# Patient Record
Sex: Male | Born: 1950
Health system: Southern US, Community
[De-identification: ages and names within clinical notes are randomized; demographics above are authoritative.]

## PROBLEM LIST (undated history)

## (undated) DIAGNOSIS — I10 Essential (primary) hypertension: Secondary | ICD-10-CM

## (undated) DIAGNOSIS — N189 Chronic kidney disease, unspecified: Secondary | ICD-10-CM

## (undated) DIAGNOSIS — E7800 Pure hypercholesterolemia, unspecified: Secondary | ICD-10-CM

## (undated) DIAGNOSIS — Z720 Tobacco use: Secondary | ICD-10-CM

## (undated) DIAGNOSIS — B1910 Unspecified viral hepatitis B without hepatic coma: Secondary | ICD-10-CM

## (undated) HISTORY — DX: Unspecified viral hepatitis B without hepatic coma: B19.10

## (undated) HISTORY — DX: Tobacco use: Z72.0

## (undated) HISTORY — DX: Chronic kidney disease, unspecified: N18.9

---

## 2014-09-10 ENCOUNTER — Ambulatory Visit (INDEPENDENT_AMBULATORY_CARE_PROVIDER_SITE_OTHER): Payer: BLUE CROSS/BLUE SHIELD

## 2014-09-10 ENCOUNTER — Ambulatory Visit (INDEPENDENT_AMBULATORY_CARE_PROVIDER_SITE_OTHER): Payer: BLUE CROSS/BLUE SHIELD | Admitting: Emergency Medicine

## 2014-09-10 VITALS — BP 115/70 | HR 79 | Temp 97.8°F | Resp 16 | Ht 65.5 in | Wt 128.0 lb

## 2014-09-10 DIAGNOSIS — Z0000 Encounter for general adult medical examination without abnormal findings: Secondary | ICD-10-CM | POA: Diagnosis not present

## 2014-09-10 DIAGNOSIS — R938 Abnormal findings on diagnostic imaging of other specified body structures: Secondary | ICD-10-CM

## 2014-09-10 DIAGNOSIS — F17200 Nicotine dependence, unspecified, uncomplicated: Secondary | ICD-10-CM

## 2014-09-10 DIAGNOSIS — B1910 Unspecified viral hepatitis B without hepatic coma: Secondary | ICD-10-CM | POA: Insufficient documentation

## 2014-09-10 DIAGNOSIS — K449 Diaphragmatic hernia without obstruction or gangrene: Secondary | ICD-10-CM | POA: Diagnosis not present

## 2014-09-10 DIAGNOSIS — Z125 Encounter for screening for malignant neoplasm of prostate: Secondary | ICD-10-CM

## 2014-09-10 DIAGNOSIS — R9389 Abnormal findings on diagnostic imaging of other specified body structures: Secondary | ICD-10-CM

## 2014-09-10 DIAGNOSIS — Z23 Encounter for immunization: Secondary | ICD-10-CM | POA: Diagnosis not present

## 2014-09-10 DIAGNOSIS — B169 Acute hepatitis B without delta-agent and without hepatic coma: Secondary | ICD-10-CM | POA: Diagnosis not present

## 2014-09-10 DIAGNOSIS — Z720 Tobacco use: Secondary | ICD-10-CM | POA: Diagnosis not present

## 2014-09-10 DIAGNOSIS — Z1329 Encounter for screening for other suspected endocrine disorder: Secondary | ICD-10-CM

## 2014-09-10 DIAGNOSIS — Q790 Congenital diaphragmatic hernia: Secondary | ICD-10-CM

## 2014-09-10 DIAGNOSIS — Z13220 Encounter for screening for lipoid disorders: Secondary | ICD-10-CM

## 2014-09-10 LAB — POCT URINALYSIS DIPSTICK
BILIRUBIN UA: NEGATIVE
Glucose, UA: NEGATIVE
Ketones, UA: NEGATIVE
Leukocytes, UA: NEGATIVE
Nitrite, UA: NEGATIVE
PH UA: 7.5
Protein, UA: NEGATIVE
Spec Grav, UA: 1.015
UROBILINOGEN UA: 0.2

## 2014-09-10 LAB — COMPREHENSIVE METABOLIC PANEL
ALT: 108 U/L — ABNORMAL HIGH (ref 0–53)
AST: 67 U/L — ABNORMAL HIGH (ref 0–37)
Albumin: 4.3 g/dL (ref 3.5–5.2)
Alkaline Phosphatase: 92 U/L (ref 39–117)
BUN: 10 mg/dL (ref 6–23)
CO2: 27 mEq/L (ref 19–32)
CREATININE: 0.8 mg/dL (ref 0.50–1.35)
Calcium: 9.3 mg/dL (ref 8.4–10.5)
Chloride: 102 mEq/L (ref 96–112)
Glucose, Bld: 96 mg/dL (ref 70–99)
Potassium: 4.3 mEq/L (ref 3.5–5.3)
Sodium: 137 mEq/L (ref 135–145)
TOTAL PROTEIN: 7.5 g/dL (ref 6.0–8.3)
Total Bilirubin: 0.7 mg/dL (ref 0.2–1.2)

## 2014-09-10 LAB — TSH: TSH: 0.599 u[IU]/mL (ref 0.350–4.500)

## 2014-09-10 LAB — LIPID PANEL
Cholesterol: 239 mg/dL — ABNORMAL HIGH (ref 0–200)
HDL: 51 mg/dL (ref >=40)
LDL Cholesterol: 166 mg/dL — ABNORMAL HIGH (ref 0–99)
TRIGLYCERIDES: 108 mg/dL (ref <150)
Total CHOL/HDL Ratio: 4.7 Ratio
VLDL: 22 mg/dL (ref 0–40)

## 2014-09-10 LAB — IFOBT (OCCULT BLOOD): IMMUNOLOGICAL FECAL OCCULT BLOOD TEST: NEGATIVE

## 2014-09-10 NOTE — Progress Notes (Signed)
   Subjective:    Patient ID: Isla Penceanh Halvorson, male    DOB: 03/05/1951, 64 y.o.   MRN: [id number]  This chart was scribed for Collene GobbleSteven A Kee Drudge, MD by Ronney LionSuzanne Le, ED Scribe. This patient was seen in room 9 and the patient's care was started at 10:31 AM.   Chief Complaint  Patient presents with  . Annual Exam     HPI  The history is provided by the patient. A language interpreter was used. HPI Comments: [name] Mossberg is a 64 y.o. male who presents to the [facility] for a complete physical exam. Patient has a history of Hepatitis B, but has not been treated for it. Patient just [place] 2 years and 4 months ago. He denies taking any medications. He denies chest pain, abdominal pain, or weight loss. Patient is 0.5 PPD smoker. He denies EtOH consumption. Patient is currently employed.   Interpreter Name: An   Review of Systems  Constitutional: Negative for unexpected weight change.  Cardiovascular: Negative for chest pain.  Gastrointestinal: Negative for abdominal pain.      Objective:   Physical Exam  Nursing note and vitals reviewed.  CONSTITUTIONAL: Well developed/well nourished HEAD: Normocephalic/atraumatic EYES: EOMI/PERRL ENMT: Mucous membranes moist NECK: supple no meningeal signs SPINE/BACK:entire spine nontender CV: S1/S2 noted, no murmurs/rubs/gallops noted LUNGS: Lungs are clear to auscultation bilaterally, no apparent distress ABDOMEN: soft, nontender, no rebound or guarding, bowel sounds noted throughout abdomen GU:no cva tenderness NEURO: Pt is awake/alert/appropriate, moves all extremitiesx4.  No facial droop.   EXTREMITIES: pulses normal/equal, full ROM SKIN: warm, color normal PSYCH: no abnormalities of mood noted, alert and oriented to situation  [facility] reading (PRIMARY) by  Dr.Pranit Owensby there is elevation of the right hemidiaphragm with blunting of the right costophrenic angle. Question whether this is scarring or secondary to  fluid.     Assessment & Plan:  Patient is a smoker with history of hepatitis B. He does not speak any [place]. Routine labs were done to see on the status of his hepatitis B. I will need to see the patient back for follow-up to see what immunizations are indicated and decide on the next step for management of his hep B and abnormal chest x-rI did go ahead and give him a  Hep A  vaccine.I personally performed the services described in this documentation, which was scribed in my presence. The recorded information has been reviewed and is accurate.

## 2014-09-11 LAB — HEPATITIS B SURFACE ANTIGEN: Hepatitis B Surface Ag: POSITIVE — AB

## 2014-09-11 LAB — HEPATITIS B SURF AG CONFIRMATION: HEPATITIS B SURFACE ANTIGEN CONFIRMATION: POSITIVE — AB

## 2014-09-11 LAB — HEPATITIS C ANTIBODY: HCV Ab: NEGATIVE

## 2014-09-12 ENCOUNTER — Other Ambulatory Visit: Payer: Self-pay | Admitting: Emergency Medicine

## 2014-09-12 DIAGNOSIS — B1910 Unspecified viral hepatitis B without hepatic coma: Secondary | ICD-10-CM

## 2014-09-12 LAB — PSA: PSA: 1.42 ng/mL (ref <=4.00)

## 2014-09-12 LAB — AFP TUMOR MARKER: AFP-Tumor Marker: 3.5 ng/mL (ref <6.1)

## 2014-09-13 ENCOUNTER — Encounter: Payer: Self-pay | Admitting: Family Medicine

## 2014-09-14 LAB — HEPATITIS B E ANTIBODY: HEPATITIS BE ANTIBODY: REACTIVE — AB

## 2014-09-14 LAB — HEPATITIS B E ANTIGEN: Hepatitis Be Antigen: NONREACTIVE

## 2014-10-01 ENCOUNTER — Ambulatory Visit (INDEPENDENT_AMBULATORY_CARE_PROVIDER_SITE_OTHER): Payer: BLUE CROSS/BLUE SHIELD | Admitting: Emergency Medicine

## 2014-10-01 VITALS — BP 122/68 | HR 80 | Temp 97.8°F | Resp 16 | Ht 65.0 in | Wt 127.2 lb

## 2014-10-01 DIAGNOSIS — E785 Hyperlipidemia, unspecified: Secondary | ICD-10-CM

## 2014-10-01 DIAGNOSIS — B1910 Unspecified viral hepatitis B without hepatic coma: Secondary | ICD-10-CM

## 2014-10-01 NOTE — Progress Notes (Signed)
Subjective:    Patient ID: Tyler Carter, male    DOB: 02/20/1951, 64 y.o.   MRN: [id number] This chart was scribed for Tyler ChrisSteven Compton Brigance, MD by Phillis HaggisGabriella Gaje, ED Scribe. This patient was seen in room 3 and patient care was started at 9:14 AM.   HPI HPI Comments: Tyler Carter is a 64 y.o. male who presents to the [facility] requesting a follow up on his blood test. He does not currently have an appointment to see a specialist. His son states that his father reports pain near his liver. His son reports that he found out about his Hepatitis B diagnosis in [place] 10 years ago. His son states that his father moved to [place] two years ago. Patient speaks [place].   Interpreter name: Ten, his son    Review of Systems  Constitutional: Negative for fever and chills.  HENT: Negative for sore throat.   Respiratory: Negative for cough and shortness of breath.   Gastrointestinal: Positive for abdominal pain. Negative for nausea and vomiting.      Objective:   Physical Exam  Constitutional: He appears well-developed and well-nourished. No distress.  HENT:  Head: Normocephalic and atraumatic.  Eyes: Conjunctivae are normal. Right eye exhibits no discharge. Left eye exhibits no discharge.  Neck: Neck supple.  Cardiovascular: Normal rate, regular rhythm and normal heart sounds.  Exam reveals no gallop and no friction rub.   No murmur heard. Pulmonary/Chest: Effort normal and breath sounds normal. No respiratory distress.  Abdominal: Soft. He exhibits no distension. There is no tenderness.  Liver is not enlarged. No right upper abdominal tenderness.   Musculoskeletal: He exhibits no edema or tenderness.  Neurological: He is alert.  Skin: Skin is warm and dry.  Psychiatric: He has a normal mood and affect. His behavior is normal. Thought content normal.  Nursing note and vitals reviewed.  Results for orders placed or performed in visit on 09/10/14  Hepatitis B surface  antigen  Result Value Ref Range   Hepatitis B Surface Ag POSITIVE (A) NEGATIVE  Hepatitis B E Antibody  Result Value Ref Range   Hepatitis Be Antibody REACTIVE (A) NON-REACTIVE  Hepatitis B E Antigen  Result Value Ref Range   Hepatitis Be Antigen NON-REACTIVE NON-REACTIVE  AFP tumor marker  Result Value Ref Range   AFP-Tumor Marker 3.5 <6.1 ng/mL  Hepatitis C antibody  Result Value Ref Range   HCV Ab NEGATIVE NEGATIVE  Comprehensive metabolic panel  Result Value Ref Range   Sodium 137 135 - 145 mEq/L   Potassium 4.3 3.5 - 5.3 mEq/L   Chloride 102 96 - 112 mEq/L   CO2 27 19 - 32 mEq/L   Glucose, Bld 96 70 - 99 mg/dL   BUN 10 6 - 23 mg/dL   Creat 7.820.80 9.560.50 - 2.131.35 mg/dL   Total Bilirubin 0.7 0.2 - 1.2 mg/dL   Alkaline Phosphatase 92 39 - 117 U/L   AST 67 (H) 0 - 37 U/L   ALT 108 (H) 0 - 53 U/L   Total Protein 7.5 6.0 - 8.3 g/dL   Albumin 4.3 3.5 - 5.2 g/dL   Calcium 9.3 8.4 - 08.610.5 mg/dL  TSH  Result Value Ref Range   TSH 0.599 0.350 - 4.500 uIU/mL  Lipid panel  Result Value Ref Range   Cholesterol 239 (H) 0 - 200 mg/dL   Triglycerides 578108 <469<150 mg/dL   HDL 51 >=62>=40 mg/dL   Total CHOL/HDL Ratio 4.7 Ratio  VLDL 22 0 - 40 mg/dL   LDL Cholesterol 161 (H) 0 - 99 mg/dL  PSA  Result Value Ref Range   PSA 1.42 <=4.00 ng/mL  Hepatitis B Surf Ag Confirmation  Result Value Ref Range   Hepatitis B Surf Ag Confirmation POS (A) NEGATIVE  IFOBT POC (occult bld, rslt in office)  Result Value Ref Range   IFOBT Negative   POCT urinalysis dipstick  Result Value Ref Range   Color, UA yellow    Clarity, UA clear    Glucose, UA neg    Bilirubin, UA neg    Ketones, UA neg    Spec Grav, UA 1.015    Blood, UA trace-intact    pH, UA 7.5    Protein, UA neg    Urobilinogen, UA 0.2    Nitrite, UA neg    Leukocytes, UA Negative       Assessment & Plan:  Patient has hepatitis B with elevated liver tests. He is he antibody positive E antigen negative. His lipids are high but I do not  want to put him on a statin. Referral has been made to infectious disease and referral made for an ultrasound of the upper abdomen.I personally performed the services described in this documentation, which was scribed in my presence. The recorded information has been reviewed and is accurate. Since I did not put him on cholesterol medicines I will have him take a baby aspirin one a day. He is also advised to quit smoking.

## 2014-10-01 NOTE — Progress Notes (Signed)
Subjective:    Patient ID: Tyler Carter, male    DOB: 02/20/1951, 64 y.o.   MRN: [id number] This chart was scribed for Tyler ChrisSteven Jerimiah Wolman, MD by Phillis HaggisGabriella Gaje, ED Scribe. This patient was seen in room 3 and patient care was started at 9:14 AM.   HPI HPI Comments: Tyler Carter is a 64 y.o. male who presents to the [facility] requesting a follow up on his blood test. He does not currently have an appointment to see a specialist. His son states that his father reports pain near his liver. His son reports that he found out about his Hepatitis B diagnosis in [place] 10 years ago. His son states that his father moved to [place] two years ago. Patient speaks [place].   Interpreter name: Ten, his son    Review of Systems  Constitutional: Negative for fever and chills.  HENT: Negative for sore throat.   Respiratory: Negative for cough and shortness of breath.   Gastrointestinal: Positive for abdominal pain. Negative for nausea and vomiting.      Objective:   Physical Exam  Constitutional: He appears well-developed and well-nourished. No distress.  HENT:  Head: Normocephalic and atraumatic.  Eyes: Conjunctivae are normal. Right eye exhibits no discharge. Left eye exhibits no discharge.  Neck: Neck supple.  Cardiovascular: Normal rate, regular rhythm and normal heart sounds.  Exam reveals no gallop and no friction rub.   No murmur heard. Pulmonary/Chest: Effort normal and breath sounds normal. No respiratory distress.  Abdominal: Soft. He exhibits no distension. There is no tenderness.  Liver is not enlarged. No right upper abdominal tenderness.   Musculoskeletal: He exhibits no edema or tenderness.  Neurological: He is alert.  Skin: Skin is warm and dry.  Psychiatric: He has a normal mood and affect. His behavior is normal. Thought content normal.  Nursing note and vitals reviewed.  Results for orders placed or performed in visit on 09/10/14  Hepatitis B surface  antigen  Result Value Ref Range   Hepatitis B Surface Ag POSITIVE (A) NEGATIVE  Hepatitis B E Antibody  Result Value Ref Range   Hepatitis Be Antibody REACTIVE (A) NON-REACTIVE  Hepatitis B E Antigen  Result Value Ref Range   Hepatitis Be Antigen NON-REACTIVE NON-REACTIVE  AFP tumor marker  Result Value Ref Range   AFP-Tumor Marker 3.5 <6.1 ng/mL  Hepatitis C antibody  Result Value Ref Range   HCV Ab NEGATIVE NEGATIVE  Comprehensive metabolic panel  Result Value Ref Range   Sodium 137 135 - 145 mEq/L   Potassium 4.3 3.5 - 5.3 mEq/L   Chloride 102 96 - 112 mEq/L   CO2 27 19 - 32 mEq/L   Glucose, Bld 96 70 - 99 mg/dL   BUN 10 6 - 23 mg/dL   Creat 7.820.80 9.560.50 - 2.131.35 mg/dL   Total Bilirubin 0.7 0.2 - 1.2 mg/dL   Alkaline Phosphatase 92 39 - 117 U/L   AST 67 (H) 0 - 37 U/L   ALT 108 (H) 0 - 53 U/L   Total Protein 7.5 6.0 - 8.3 g/dL   Albumin 4.3 3.5 - 5.2 g/dL   Calcium 9.3 8.4 - 08.610.5 mg/dL  TSH  Result Value Ref Range   TSH 0.599 0.350 - 4.500 uIU/mL  Lipid panel  Result Value Ref Range   Cholesterol 239 (H) 0 - 200 mg/dL   Triglycerides 578108 <469<150 mg/dL   HDL 51 >=62>=40 mg/dL   Total CHOL/HDL Ratio 4.7 Ratio  VLDL 22 0 - 40 mg/dL   LDL Cholesterol 409166 (H) 0 - 99 mg/dL  PSA  Result Value Ref Range   PSA 1.42 <=4.00 ng/mL  Hepatitis B Surf Ag Confirmation  Result Value Ref Range   Hepatitis B Surf Ag Confirmation POS (A) NEGATIVE  IFOBT POC (occult bld, rslt in office)  Result Value Ref Range   IFOBT Negative   POCT urinalysis dipstick  Result Value Ref Range   Color, UA yellow    Clarity, UA clear    Glucose, UA neg    Bilirubin, UA neg    Ketones, UA neg    Spec Grav, UA 1.015    Blood, UA trace-intact    pH, UA 7.5    Protein, UA neg    Urobilinogen, UA 0.2    Nitrite, UA neg    Leukocytes, UA Negative       Assessment & Plan:  Patient has hepatitis B with elevated liver tests. He is he antibody positive E antigen negative. His lipids are high but I do not  want to put him on a statin. Referral has been made to infectious disease and referral made for an ultrasound of the upper abdomen.I personally performed the services described in this documentation, which was scribed in my presence. The recorded information has been reviewed and is accurate.

## 2014-10-01 NOTE — Patient Instructions (Addendum)
Please return here in September to have your second hepatitis A vaccination. We are making you appointments to see a hepatitis specialist and to have an x-ray of your upper abdomen.Cai thu?c l (Smoking Cessation) B? ht thu?c c  ngh?a quan tr?ng ??i v?i s?c kh?e c?a qu v? v c nhi?u l?i ch. Tuy nhin, khng ph?i lun d? dng b? thu?c v nicotine l m?t lo?i ch?t gy nghi?n. Thng th??ng, m?i ng??i c? g?ng t? 3 l?n tr? ln tr??c khi c th? b? thu?c l. Ti li?u ny gi?i thch nh?ng cch t?t nh?t ?? qu v? c th? chu?n b? cho vi?c b? ht thu?c. B? thu?c l l vi?c lm kh kh?n v c?n r?t nhi?u n? l?c, nh?ng qu v? c th? lm ?i?u ?. ?U ?I?M C?A B? HT THU?C  Qu v? s? s?ng lu h?n, c?m th?y kh?e h?n v s?ng t?t h?n.  C? th? c?a qu v? s? c?m nh?n ???c tc ??ng c?a vi?c b? thu?c g?n nh? ngay l?p t?c.  Trong vng 20 pht, huy?t p s? gi?m. M?ch ??p tr? l?i m?c bnh th??ng.  Sau 8 gi?, n?ng ?? cacbon monoxit trong mu s? tr? v? bnh th??ng. N?ng ?? oxy c?a qu v? t?ng.  Sau 24 gi?, nguy c? b? ?au tim b?t ??u gi?m. H?i th?, tc v c? th? qu v? s? h?t mi khi thu?c.  Sau 48 gi?, cc ??u dy th?n kinh b? t?n th??ng b?t ??u h?i ph?c. Kh?u gic v v? gic s? c?i thi?n.  Sau 72 gi?, c? th? h?u nh? khng cn nicotine. ?ng ph? qu?n s? gin ra v th? d? h?n.  Sau 2 ??n 12 tu?n, ph?i c th? gi? khng kh nhi?u h?n. T?p th? d?c tr? nn d? dng h?n v c?i thi?n tu?n hon.  Nguy c? b? nh?i mu c? tim, ??t qu?, ung th? ho?c b?nh ph?i gi?m ?ng k?.  Sau 1 n?m, nguy c? b? b?nh tim do m?ch vnh s? gi?m m?t n?a.  Sau 5 n?m, nguy c? ??t qu? gi?m xu?ng gi?ng nh? ng??i khng ht thu?c.  Sau 10 n?m, nguy c? b? ung th? ph?i gi?m ?i m?t n?a v nguy c? b? b?nh ung th? khc s? gi?m ?ng k?.  Sau 15 n?m, nguy c? b? b?nh tim do m?ch vnh gi?m, th??ng l b?ng m?c ?? c?a m?t ng??i khng ht thu?c.  N?u qu v? ?ang mang thai, b? ht thu?c s? c?i thi?n c? h?i c m?t em b kh?e m?nh.  Nh?ng ng??i s?ng chung v?i  qu v?, ??c bi?t l m?i tr? em, s? kh?e m?nh h?n.  Qu v? s? c thm ti?n ?? chi tiu vo nh?ng th? khc ngoi thu?c l. CC CU H?I C?N SUY NGH? TR??C KHI TM CCH B? THU?C L Qu v? c th? mu?n ni v? cc cu tr? l?i c?a qu v? v?i chuyn gia ch?m [place] s?c kh?e.  T?i sao qu v? mu?n b? thu?c l?  N?u qu v? ? c? g?ng b? thu?c l trong qu kh?, nh?ng g ? c tc d?ng v nh?ng g khng c tc d?ng v?i qu v??  ?u l nh?ng tnh hu?ng kh kh?n nh?t ??i v?i qu v? sau khi b? thu?c l? Qu v? ??nh x? l cc tnh hu?ng ? nh? th? no?  Ai c th? gip qu v? v??t qua nh?ng th?i ?i?m kh kh?n? Gia ?nh qu v?? B?n b? M?t chuyn gia ch?m [place] s?c kh?e?  Qu v? c?m  th?y thch th nh? th? no khi ht thu?c l? C cch no ?? v?n c ???c ni?m vui ? n?u qu v? b? thu?c l? ?y l m?t s? cu h?i ??t ra v?i chuyn gia ch?m [place] s?c kh?e c?a qu v?:  Lm th? no ng/b c th? gip ti b? thu?c l thnh cng?  Theo ng/b, lo?i thu?c no s? l t?t nh?t cho ti v ti nn dng n nh? th? no?  Ti c?n lm g n?u ti c?n thm tr? gip?  Cai thu?c s? c c?m gic nh? th? no? Lm th? no ti c th? l?y ???c thng tin v? cai thu?c? CHU?N B? S?N SNG  Ch?n ngy b? thu?c l.  Thay ??i mi tr??ng c?a qu v? b?ng cch v?t b? t?t c? thu?c l, g?t tn, dim v b?t l?a trong nh, trong xe ho?c ? n?i lm vi?c. Khng cho php m?i ng??i ht thu?c trong nh qu v?.  Xem l?i n? l?c b? thu?c l tr??c ?y c?a qu v?. Hy suy ngh? v? nh?ng g c hi?u qu? v nh?ng g khng. NH?N H? TR? V KHUY?N [facility] Qu v? c nhi?u kh? n?ng thnh cng h?n n?u ???c gip ??[place] v? c th? ???c h? tr? b?ng nhi?u cch.  Ni v?i gia ?nh, b?n b v ??ng nghi?p r?ng qu v? s? b? thu?c l v c?n s? h? tr? c?a h?. Yu c?u h? khng ht thu?c xung quanh qu v?.  Nh?n t? v?n v h? tr? c nhn, nhm ho?c qua ?i?n tho?i. Cc ch??ng trnh ???c cung c?p t?i cc b?nh vi?n v trung tm y t? ??a ph??ng. Hy g?i cho s? y t? ??a ph??ng ?? bi?t thng  tin v? cc ch??ng trnh trong khu v?c c?a qu v?.  Ni?m tin v th?c hnh tm linh c th? gip m?t s? ng??i ht thu?c b? thu?c l.  T?i v? "quit meter" (??ng h? b? thu?c l) trn my tnh c?a qu v? ?? theo di s? li?u th?ng k b? thu?c l, ch?ng h?n nh? qu v? ? khng ht thu?c ???c bao lu, s? l??ng thu?c khng ht v ti?n ti?t ki?m ???c.  Nh?n m?t cu?n sch t? gip ?? ?? b? thu?c l v trnh xa thu?c l. H?C CC K? N?NG V HNH VI M?I  T? ?nh l?c h??ng kh?i nhu c?u ht thu?c. Ni chuy?n v?i m?t ai ?, ?i b? ho?c gi?t th?i gian b?ng cng vi?c.  Thay ??i thi quen bnh th??ng c?a qu v?. Thay ??i l? trnh ??n n?i lm vi?c. U?ng tr thay cho c ph. ?n sng t?i m?t n?i khc.  Gi?m b?t c?ng th?ng. T?m n??c nng, t?p th? d?c ho?c ??c sch.  D? ??nh lm m?t vi?c g ? th v? m?i ngy. T? th??ng cho mnh do khng ht thu?c.  Khm ph cc ch??ng trnh t??ng tc d?a trn web chuyn gip qu v? b? thu?c l. MUA D??C PH?M V S? D?NG D??C PH?M H?P L D??c ph?m c th? gip qu v? ng?ng ht thu?c v gi?m c?n thm thu?c l. K?t h?p d??c ph?m v?i cc ph??ng php hnh vi v h? tr? ? trn c th? t?ng ?ng k? kh? n?ng b? thu?c l thnh cng c?a qu v?.  Li?u php thay th? nicotine gip cung c?p nicotine cho c? th? c?a qu v? m khng c?n tc ??ng tiu c?c v r?i ro c?a vi?c ht thu?c. Li?u php thay th? nicotine bao g?m k?o,  vin ng?m, thu?c ht, thu?c x?t m?i v mi?ng dn da ch?a nicotine. M?t s? c th? mua tr?c ti?p t?i hi?u thu?c, trong khi m?t s? khc c?n k ??n c?a bc s?.  Thu?c ch?ng tr?m c?m gip nh?ng ng??i b? ht thu?c, nh?ng khng r n ho?t ??ng nh? th? no. Lo?i thu?c ny c s?n theo ??n thu?c.  Thu?c ch? v?n m?t ph?n th? th? nicotinic m ph?ng tc ??ng c?a nicotin trong no c?a qu v?. Lo?i thu?c ny c s?n theo ??n thu?c. Hy h?i chuyn gia ch?m [place] s?c kh?e ?? ???c t? v?n v? nh?ng lo?i thu?c ?? s? d?ng v cch s? d?ng chng d?a vo b?nh s? c?a qu v?. Chuyn gia ch?m [place] s?c kh?e s? cho  qu v? bi?t c?n ??  nh?ng tc d?ng ph? no n?u qu v? ch?n s? d?ng m?t lo?i d??c ph?m ho?c li?u php. ??c k? thng tin trn bao b. Khng s? d?ng b?t k? s?n ph?m no khc c ch?a nicotine trong khi s? d?ng m?t s?n ph?m thay th? nicotine.  TI PHT HO?C TNH HU?NG KH KH?N H?u h?t cc tr??ng h?p ti pht x?y ra trong vng 3 thng ??u tin sau khi b? thu?c. Khng n?n lng n?u qu v? b?t ??u ht thu?c tr? l?i. Hy nh? r?ng, h?u h?t m?i ng??i c? g?ng nhi?u l?n tr??c khi b? h?n thu?c. Qu v? c th? c cc tri?u ch?ng cai thu?c v c? th? c?a qu v? ? quen v?i nicotine. Qu v? c th? thm thu?c l, b? kch thch, c?m th?y r?t ?i, ho th??ng xuyn, b? ?au ??u, ho?c kh t?p trung. Cc tri?u ch?ng t? b? ch? l t?m th?i. Cc tri?u ch?ng ny m?nh nh?t khi qu v? b?t ??u b? thu?c l, nh?ng chng s? bi?n m?t trong vng 10-14 ngy. ?? gi?m nguy c? ti pht, c? g?ng:  Trnh u?ng r??u. U?ng r??u lm gi?m c? h?i b? thu?c thnh cng c?a qu v?.  Gi?m l??ng caffeine qu v? tiu th?. M?t khi qu v? b? thu?c l, l??ng caffeine trong c? th? c?a qu v? t?ng v c th? t?o cho qu v? cc tri?u ch?ng nh? tim ??p nhanh, ?? m? hi v lo l?ng.  Trnh nh?ng ng??i ht thu?c v h? c th? khi?n qu v? mu?n ht thu?c.  ??ng ?? vi?c t?ng cn lm qu v? m?t t?p trung. Nhi?u ng??i ht thu?c s? t?ng cn khi b? thu?c, th??ng t h?n 10 pao. ?n m?t ch? ?? ?n u?ng lnh m?nh v duy tr ho?t ??ng. Qu v? lun c th? gi?m s? cn ? t?ng sau khi b? thu?c l.  Tm cch ?? c?i thi?n tm tr?ng c?a qu v? ngoi vi?c s? d?ng cch ht thu?c. ?? BI?T THM THNG TIN  www.smokefree.gov  Document Released: 09/18/2006 Document Revised: 10/18/2013 [facility] Patient Information 2015 [place], [place]. This information is not intended to replace advice given to you by your health care provider. Make sure you discuss any questions you have with your health care provider.

## 2014-10-31 ENCOUNTER — Ambulatory Visit: Payer: Self-pay | Admitting: Infectious Disease

## 2015-02-25 ENCOUNTER — Ambulatory Visit (INDEPENDENT_AMBULATORY_CARE_PROVIDER_SITE_OTHER): Payer: BLUE CROSS/BLUE SHIELD | Admitting: Family Medicine

## 2015-02-25 VITALS — BP 110/64 | HR 68 | Temp 98.1°F | Resp 16 | Ht 65.0 in | Wt 131.6 lb

## 2015-02-25 DIAGNOSIS — B181 Chronic viral hepatitis B without delta-agent: Secondary | ICD-10-CM

## 2015-02-25 DIAGNOSIS — Z789 Other specified health status: Secondary | ICD-10-CM | POA: Diagnosis not present

## 2015-02-25 DIAGNOSIS — J309 Allergic rhinitis, unspecified: Secondary | ICD-10-CM

## 2015-02-25 DIAGNOSIS — Z23 Encounter for immunization: Secondary | ICD-10-CM

## 2015-02-25 NOTE — Progress Notes (Addendum)
Subjective:  This chart was scribed for [name], MD by [facility], medical scribe at [facility].The patient was seen in exam room 09 and the patient's care was started at 8:59 AM.   Patient ID: Tyler Carter, male    DOB: 07-19-1950, 63 y.o.   MRN: [id number] Chief Complaint  Patient presents with  . Sinus Problem    phlem running down back of throat  . Hepatitis B    2nd    HPI HPI Comments: Tyler Carter is a 64 y.o. male who presents to [facility] persistent nasal congestion an a Hep A vaccine.  Nasal Congestion: Intermittent nasal congestion, for six years. Associated post nasal drip, rhinorrhea, sneezing, and cough. No fever, chest congestion, and dyspnea.  Hep A Vaccine: He also needs the Hep A Vaccine. First vaccine given March 26 th 2016. History of chronic hep B, with positive hep B surface antigen but negative Hep B-E antigen. Positive B-E Antibody. Referred on May 16 th to Infectious disease Patient did not go. Ordered US of his upper abdomen this has not been obtained.  Lab Results  Component Value Date   ALT 108* 09/10/2014   AST 67* 09/10/2014   ALKPHOS 92 09/10/2014   BILITOT 0.7 09/10/2014   Pt speaks [place] and his son was present to translate.  Patient Active Problem List   Diagnosis Date Noted  . Hyperlipidemia 10/01/2014  . Hep B w/o coma 09/10/2014   History reviewed. No pertinent past medical history. History reviewed. No pertinent past surgical history. No Known Allergies Prior to Admission medications   Not on File   Social History   Social History  . Marital Status: Unknown    Spouse Name: N/A  . Number of Children: N/A  . Years of Education: N/A   Occupational History  . Not on file.   Social History Main Topics  . Smoking status: Current Every Day Smoker -- 0.50 packs/day for 20 years    Types: Cigarettes  . Smokeless tobacco: Not on file  . Alcohol Use: No  . Drug Use: No  . Sexual  Activity: Not on file   Other Topics Concern  . Not on file   Social History Narrative   Review of Systems  Constitutional: Negative for fever.  HENT: Positive for congestion, postnasal drip, rhinorrhea and sneezing.   Respiratory: Positive for cough. Negative for shortness of breath.       Objective:  BP 110/64 mmHg  Pulse 68  Temp(Src) 98.1 F (36.7 C) (Oral)  Resp 16  Ht 5\' 5"  (1.651 m)  Wt 131 lb 9.6 oz (59.693 kg)  BMI 21.90 kg/m2  SpO2 98% Physical Exam  Constitutional: He is oriented to person, place, and time. He appears well-developed and well-nourished. No distress.  HENT:  Head: Normocephalic and atraumatic.  Mouth/Throat: Oropharynx is clear and moist.  TM's pearly grey. Sinuses are non tender. Minimal edema of the turbinates no active discharge.  Eyes: Pupils are equal, round, and reactive to light. No scleral icterus.  Neck: Normal range of motion.  Cardiovascular: Normal rate and regular rhythm.   Pulmonary/Chest: Effort normal. No respiratory distress.  Musculoskeletal: Normal range of motion.  Lymphadenopathy:    He has no cervical adenopathy.  Neurological: He is alert and oriented to person, place, and time.  Skin: Skin is warm and dry.  No apparent jaundice.   Psychiatric: He has a normal mood and affect. His behavior is  normal.  Nursing note and vitals reviewed.     Assessment & Plan:  Tyler Carter is a 64 y.o. male Chronic hepatitis B - Plan: Hepatitis A vaccine adult IM, Ambulatory referral to Infectious Disease   -Unknown reason for no show first ID appointment. Referral placed again. He isn't willing to have any right upper quadrant pain no abdominal pain on exam, deferred ultrasound at this time, but if symptoms recur, return to clinic.  Need for prophylactic vaccination and inoculation against viral hepatitis - Plan: Hepatitis A vaccine adult IM given  Allergic rhinitis, unspecified allergic rhinitis type  -Somewhat difficult history, but  after further discussion appears this is postnasal drainage and congestion. I suspect this is allergy base, as he is afebrile, overall clear his lung exam, no lower respiratory infection symptoms. Can try over-the-counter Claritin, Flonase, then return to clinic if is not improving. RTC precautions if worsening  Language barrier  -Son translating, with understanding expressed.  No orders of the defined types were placed in this encounter.   Patient Instructions  Second hepatitis A vaccine given today. We will refer you to infectious disease again, but please make sure you make this appointment. Please call us if there are questions regarding this appointment.  Sinus congestion and drainage down the back of the throat appears to be due from allergies. Try over-the-counter Claritin 1 per day, and Flonase nasal spray 2 sprays in each nostril once per day if needed. If symptoms are not improved with these medications, return for recheck.  Return to the clinic or go to the nearest emergency room if any of your symptoms worsen or new symptoms occur.  Hepatitis A Vaccine: What You Need to Know 1. What is hepatitis A? Hepatitis A is a serious liver disease caused by the hepatitis A virus (HAV). HAV is found in the stool of people with hepatitis A. It is usually spread by close personal contact and sometimes by eating food or drinking water containing HAV. A person who has hepatitis A can easily pass the disease to others within the same household. Hepatitis A can cause:  "flu-like" illness  jaundice (yellow skin or eyes, dark urine)  severe stomach pains and diarrhea (children) People with hepatitis A often have to be hospitalized (up to about 1 person in 5). Adults with hepatitis A are often too ill to work for up to a month. Sometimes, people die as a result of hepatitis A (about 3-6 deaths per 1,000 cases). Hepatitis A vaccine can prevent hepatitis A. 2. Who should get hepatitis A vaccine and  when? WHO Some people should be routinely vaccinated with hepatitis A vaccine:  All children between their first and second birthdays (33 through 12 months of age).  Anyone 1 year of age and older traveling to or working in countries with high or intermediate prevalence of hepatitis A, such as those located in [place] or [place], [place], [place] (except [place]), [place], and [place]. For more information see http://www.church.org/.  Children and adolescents 2 through 95 years of age who live in states or communities where routine vaccination has been implemented because of high disease incidence.  Men who have sex with men.  People who use street drugs.  People with chronic liver disease.  People who are treated with clotting factor concentrates.  People who work with HAV-infected primates or who work with HAV in Music therapist.  Members of households planning to adopt a child, or care for a newly arriving adopted child, from  a country where hepatitis A is common. Other people might get hepatitis A vaccine in certain situations (ask your doctor for more details):  Unvaccinated children or adolescents in communities where outbreaks of hepatitis A are occurring.  Unvaccinated people who have been exposed to hepatitis A virus.  Anyone 1 year of age or older who wants protection from hepatitis A. Hepatitis A vaccine is not licensed for children younger than 1 year of age. WHEN For children, the first dose should be given at 36 through 43 months of age. Children who are not vaccinated by 41 years of age can be vaccinated at later visits. For others at risk, the hepatitis A vaccine series may be started whenever a person wishes to be protected or is at risk of infection. For travelers, it is best to start the vaccine series at least 1 month before traveling. (Some protection may still result if the vaccine is given on or closer to the travel date.) Some people who cannot get the  vaccine before traveling, or for whom the vaccine might not be effective, can get a shot called immune globulin (IG). IG gives immediate, temporary protection. Two doses of the vaccine are needed for lasting protection. These doses should be given at least 6 months apart. Hepatitis A vaccine may be given at the same time as other vaccines. 3. Some people should not get hepatitis A vaccine or should wait.  Anyone who has ever had a severe (life threatening) allergic reaction to a previous dose of hepatitis A vaccine should not get another dose.  Anyone who has a severe (life threatening) allergy to any vaccine component should not get the vaccine.  Tell your doctor if you have any severe allergies, including a severe allergy to latex. All hepatitis A vaccines contain alum, and some hepatitis A vaccines contain 2-phenoxyethanol.  Anyone who is moderately or severely ill at the time the shot is scheduled should probably wait until they recover. Ask your doctor. People with a mild illness can usually get the vaccine.  Tell your doctor if you are pregnant. Because hepatitis A vaccine is inactivated (killed), the risk to a pregnant woman or her unborn baby is believed to be very low. But your doctor can weigh any theoretical risk from the vaccine against the need for protection. 4. What are the risks from hepatitis A vaccine? A vaccine, like any medicine, could possibly cause serious problems, such as severe allergic reactions. The risk of hepatitis A vaccine causing serious harm, or death, is extremely small. Getting hepatitis A vaccine is much safer than getting the disease. Mild problems  soreness where the shot was given (about 1 out of 2 adults, and up to 1 out of 6 children)  headache (about 1 out of 6 adults and 1 out of 25 children)  loss of appetite (about 1 out of 12 children)  tiredness (about 1 out of 14 adults) If these problems occur, they usually last 1 or 2 days. Severe  problems  serious allergic reaction, within a few minutes to a few hours after the shot (very rare). 5. What if there is a serious reaction? What should I look for?  Look for anything that concerns you, such as signs of a severe allergic reaction, very high fever, or behavior changes. Signs of a severe allergic reaction can include hives, swelling of the face and throat, difficulty breathing, a fast heartbeat, dizziness, and weakness. These would start a few minutes to a few hours after the  vaccination. What should I do?  If you think it is a severe allergic reaction or other emergency that can't wait, call 9-1-1 or get the person to the nearest hospital. Otherwise, call your doctor.  Afterward, the reaction should be reported to the Vaccine Adverse Event Reporting System (VAERS). Your doctor might file this report, or you can do it yourself through the VAERS web site at www.vaers.LAgents.no, or by calling 1-613-116-1789. VAERS is only for reporting reactions. They do not give medical advice. 6. The National Vaccine Injury Compensation Program The [employer] Vaccine Injury Compensation Program (VICP) is a federal program that was created to compensate people who may have been injured by certain vaccines. Persons who believe they may have been injured by a vaccine can learn about the program and about filing a claim by calling 1-201 648 1255 or visiting the VICP website at SpiritualWord.at. 7. How can I learn more?  Ask your doctor.  Call your local or state health department.  Contact the Centers for Disease Control and Prevention (CDC):  Call (747) 740-2566 (1-800-CDC-INFO) or  Visit CDC's website at: PicCapture.uy CDC Hepatitis A Vaccine VIS (Interim) (04/10/10)  Document Released: 03/28/2006 Document Revised: 10/18/2013 Document Reviewed: 07/15/2013 [employer] Patient Information 2015 [place], [place]. This information is not intended to replace advice given to you by  your health care provider. Make sure you discuss any questions you have with your health care provider.  Allergic Rhinitis Allergic rhinitis is when the mucous membranes in the nose respond to allergens. Allergens are particles in the air that cause your body to have an allergic reaction. This causes you to release allergic antibodies. Through a chain of events, these eventually cause you to release histamine into the blood stream. Although meant to protect the body, it is this release of histamine that causes your discomfort, such as frequent sneezing, congestion, and an itchy, runny nose.  CAUSES  Seasonal allergic rhinitis (hay fever) is caused by pollen allergens that may come from grasses, trees, and weeds. Year-round allergic rhinitis (perennial allergic rhinitis) is caused by allergens such as house dust mites, pet dander, and mold spores.  SYMPTOMS   Nasal stuffiness (congestion).  Itchy, runny nose with sneezing and tearing of the eyes. DIAGNOSIS  Your health care provider can help you determine the allergen or allergens that trigger your symptoms. If you and your health care provider are unable to determine the allergen, skin or blood testing may be used. TREATMENT  Allergic rhinitis does not have a cure, but it can be controlled by:  Medicines and allergy shots (immunotherapy).  Avoiding the allergen. Hay fever may often be treated with antihistamines in pill or nasal spray forms. Antihistamines block the effects of histamine. There are over-the-counter medicines that may help with nasal congestion and swelling around the eyes. Check with your health care provider before taking or giving this medicine.  If avoiding the allergen or the medicine prescribed do not work, there are many new medicines your health care provider can prescribe. Stronger medicine may be used if initial measures are ineffective. Desensitizing injections can be used if medicine and avoidance does not work.  Desensitization is when a patient is given ongoing shots until the body becomes less sensitive to the allergen. Make sure you follow up with your health care provider if problems continue. HOME CARE INSTRUCTIONS It is not possible to completely avoid allergens, but you can reduce your symptoms by taking steps to limit your exposure to them. It helps to know exactly what you are  allergic to so that you can avoid your specific triggers. SEEK MEDICAL CARE IF:   You have a fever.  You develop a cough that does not stop easily (persistent).  You have shortness of breath.  You start wheezing.  Symptoms interfere with normal daily activities. Document Released: 02/26/2001 Document Revised: 06/08/2013 Document Reviewed: 02/08/2013 [facility] Patient Information 2015 [place], [place]. This information is not intended to replace advice given to you by your health care provider. Make sure you discuss any questions you have with your health care provider.      I personally performed the services described in this documentation, which was scribed in my presence. The recorded information has been reviewed and considered, and addended by me as needed.

## 2015-02-25 NOTE — Patient Instructions (Addendum)
Second hepatitis A vaccine given today. We will refer you to infectious disease again, but please make sure you make this appointment. Please call us if there are questions regarding this appointment.  Sinus congestion and drainage down the back of the throat appears to be due from allergies. Try over-the-counter Claritin 1 per day, and Flonase nasal spray 2 sprays in each nostril once per day if needed. If symptoms are not improved with these medications, return for recheck.  Return to the clinic or go to the nearest emergency room if any of your symptoms worsen or new symptoms occur.  Hepatitis A Vaccine: What You Need to Know 1. What is hepatitis A? Hepatitis A is a serious liver disease caused by the hepatitis A virus (HAV). HAV is found in the stool of people with hepatitis A. It is usually spread by close personal contact and sometimes by eating food or drinking water containing HAV. A person who has hepatitis A can easily pass the disease to others within the same household. Hepatitis A can cause:  "flu-like" illness  jaundice (yellow skin or eyes, dark urine)  severe stomach pains and diarrhea (children) People with hepatitis A often have to be hospitalized (up to about 1 person in 5). Adults with hepatitis A are often too ill to work for up to a month. Sometimes, people die as a result of hepatitis A (about 3-6 deaths per 1,000 cases). Hepatitis A vaccine can prevent hepatitis A. 2. Who should get hepatitis A vaccine and when? WHO Some people should be routinely vaccinated with hepatitis A vaccine:  All children between their first and second birthdays (66 through 19 months of age).  Anyone 1 year of age and older traveling to or working in countries with high or intermediate prevalence of hepatitis A, such as those located in [place] or [place], [place], [place] (except [place]), [place], and [place]. For more information see http://www.church.org/.  Children and adolescents  2 through 57 years of age who live in states or communities where routine vaccination has been implemented because of high disease incidence.  Men who have sex with men.  People who use street drugs.  People with chronic liver disease.  People who are treated with clotting factor concentrates.  People who work with HAV-infected primates or who work with HAV in Music therapist.  Members of households planning to adopt a child, or care for a newly arriving adopted child, from a country where hepatitis A is common. Other people might get hepatitis A vaccine in certain situations (ask your doctor for more details):  Unvaccinated children or adolescents in communities where outbreaks of hepatitis A are occurring.  Unvaccinated people who have been exposed to hepatitis A virus.  Anyone 1 year of age or older who wants protection from hepatitis A. Hepatitis A vaccine is not licensed for children younger than 1 year of age. WHEN For children, the first dose should be given at 77 through 67 months of age. Children who are not vaccinated by 107 years of age can be vaccinated at later visits. For others at risk, the hepatitis A vaccine series may be started whenever a person wishes to be protected or is at risk of infection. For travelers, it is best to start the vaccine series at least 1 month before traveling. (Some protection may still result if the vaccine is given on or closer to the travel date.) Some people who cannot get the vaccine before traveling, or for whom the vaccine might  not be effective, can get a shot called immune globulin (IG). IG gives immediate, temporary protection. Two doses of the vaccine are needed for lasting protection. These doses should be given at least 6 months apart. Hepatitis A vaccine may be given at the same time as other vaccines. 3. Some people should not get hepatitis A vaccine or should wait.  Anyone who has ever had a severe (life threatening) allergic  reaction to a previous dose of hepatitis A vaccine should not get another dose.  Anyone who has a severe (life threatening) allergy to any vaccine component should not get the vaccine.  Tell your doctor if you have any severe allergies, including a severe allergy to latex. All hepatitis A vaccines contain alum, and some hepatitis A vaccines contain 2-phenoxyethanol.  Anyone who is moderately or severely ill at the time the shot is scheduled should probably wait until they recover. Ask your doctor. People with a mild illness can usually get the vaccine.  Tell your doctor if you are pregnant. Because hepatitis A vaccine is inactivated (killed), the risk to a pregnant woman or her unborn baby is believed to be very low. But your doctor can weigh any theoretical risk from the vaccine against the need for protection. 4. What are the risks from hepatitis A vaccine? A vaccine, like any medicine, could possibly cause serious problems, such as severe allergic reactions. The risk of hepatitis A vaccine causing serious harm, or death, is extremely small. Getting hepatitis A vaccine is much safer than getting the disease. Mild problems  soreness where the shot was given (about 1 out of 2 adults, and up to 1 out of 6 children)  headache (about 1 out of 6 adults and 1 out of 25 children)  loss of appetite (about 1 out of 12 children)  tiredness (about 1 out of 14 adults) If these problems occur, they usually last 1 or 2 days. Severe problems  serious allergic reaction, within a few minutes to a few hours after the shot (very rare). 5. What if there is a serious reaction? What should I look for?  Look for anything that concerns you, such as signs of a severe allergic reaction, very high fever, or behavior changes. Signs of a severe allergic reaction can include hives, swelling of the face and throat, difficulty breathing, a fast heartbeat, dizziness, and weakness. These would start a few minutes to a  few hours after the vaccination. What should I do?  If you think it is a severe allergic reaction or other emergency that can't wait, call 9-1-1 or get the person to the nearest hospital. Otherwise, call your doctor.  Afterward, the reaction should be reported to the Vaccine Adverse Event Reporting System (VAERS). Your doctor might file this report, or you can do it yourself through the VAERS web site at www.vaers.LAgents.no, or by calling 1-(272)647-6877. VAERS is only for reporting reactions. They do not give medical advice. 6. The National Vaccine Injury Compensation Program The [employer] Vaccine Injury Compensation Program (VICP) is a federal program that was created to compensate people who may have been injured by certain vaccines. Persons who believe they may have been injured by a vaccine can learn about the program and about filing a claim by calling 1-786-239-4907 or visiting the VICP website at SpiritualWord.at. 7. How can I learn more?  Ask your doctor.  Call your local or state health department.  Contact the Centers for Disease Control and Prevention (CDC):  Call 774-141-3832 (1-800-CDC-INFO) or  Visit CDC's website at: PicCapture.uy CDC Hepatitis A Vaccine VIS (Interim) (04/10/10)  Document Released: 03/28/2006 Document Revised: 10/18/2013 Document Reviewed: 07/15/2013 ExitCare Patient Information 2015 [place], [place]. This information is not intended to replace advice given to you by your health care provider. Make sure you discuss any questions you have with your health care provider.  Allergic Rhinitis Allergic rhinitis is when the mucous membranes in the nose respond to allergens. Allergens are particles in the air that cause your body to have an allergic reaction. This causes you to release allergic antibodies. Through a chain of events, these eventually cause you to release histamine into the blood stream. Although meant to protect the body, it is  this release of histamine that causes your discomfort, such as frequent sneezing, congestion, and an itchy, runny nose.  CAUSES  Seasonal allergic rhinitis (hay fever) is caused by pollen allergens that may come from grasses, trees, and weeds. Year-round allergic rhinitis (perennial allergic rhinitis) is caused by allergens such as house dust mites, pet dander, and mold spores.  SYMPTOMS   Nasal stuffiness (congestion).  Itchy, runny nose with sneezing and tearing of the eyes. DIAGNOSIS  Your health care provider can help you determine the allergen or allergens that trigger your symptoms. If you and your health care provider are unable to determine the allergen, skin or blood testing may be used. TREATMENT  Allergic rhinitis does not have a cure, but it can be controlled by:  Medicines and allergy shots (immunotherapy).  Avoiding the allergen. Hay fever may often be treated with antihistamines in pill or nasal spray forms. Antihistamines block the effects of histamine. There are over-the-counter medicines that may help with nasal congestion and swelling around the eyes. Check with your health care provider before taking or giving this medicine.  If avoiding the allergen or the medicine prescribed do not work, there are many new medicines your health care provider can prescribe. Stronger medicine may be used if initial measures are ineffective. Desensitizing injections can be used if medicine and avoidance does not work. Desensitization is when a patient is given ongoing shots until the body becomes less sensitive to the allergen. Make sure you follow up with your health care provider if problems continue. HOME CARE INSTRUCTIONS It is not possible to completely avoid allergens, but you can reduce your symptoms by taking steps to limit your exposure to them. It helps to know exactly what you are allergic to so that you can avoid your specific triggers. SEEK MEDICAL CARE IF:   You have a  fever.  You develop a cough that does not stop easily (persistent).  You have shortness of breath.  You start wheezing.  Symptoms interfere with normal daily activities. Document Released: 02/26/2001 Document Revised: 06/08/2013 Document Reviewed: 02/08/2013 [facility] Patient Information 2015 [place], [place]. This information is not intended to replace advice given to you by your health care provider. Make sure you discuss any questions you have with your health care provider.

## 2015-04-06 ENCOUNTER — Ambulatory Visit (INDEPENDENT_AMBULATORY_CARE_PROVIDER_SITE_OTHER): Payer: Self-pay | Admitting: Infectious Diseases

## 2015-04-06 ENCOUNTER — Encounter: Payer: Self-pay | Admitting: *Deleted

## 2015-04-06 ENCOUNTER — Encounter: Payer: Self-pay | Admitting: Infectious Diseases

## 2015-04-06 ENCOUNTER — Ambulatory Visit: Payer: Self-pay | Admitting: Infectious Diseases

## 2015-04-06 VITALS — BP 132/82 | HR 89 | Temp 97.2°F | Wt 138.2 lb

## 2015-04-06 DIAGNOSIS — F17200 Nicotine dependence, unspecified, uncomplicated: Secondary | ICD-10-CM | POA: Insufficient documentation

## 2015-04-06 DIAGNOSIS — N529 Male erectile dysfunction, unspecified: Secondary | ICD-10-CM | POA: Insufficient documentation

## 2015-04-06 DIAGNOSIS — B1910 Unspecified viral hepatitis B without hepatic coma: Secondary | ICD-10-CM

## 2015-04-06 NOTE — Assessment & Plan Note (Addendum)
He has mild elevation of his LFTs Needs elastography Needs Hep B DNA, Hep B Sab/Ag/Core Encouraged to avoid ETOH, tylenol.  Has gotten Hep A vax He is returning to [place] to visit family in 1 month, I told him we should have diagnostics done by then.  Will see him back in 3 weeks to decide if he needs treatment.

## 2015-04-06 NOTE — Progress Notes (Signed)
   Subjective:    Patient ID: Tyler Carter, male    DOB: 12/08/1950, 64 y.o.   MRN: [id number]  HPI 64 yo M with hx of Hepatitis B. He believes he was infected with this in [place], has been in [place].  No previous issues with hepatitis- no change in color of stools (yellow stool), no nose/gum or bowel bleeding. No icterus.  Has been vaccinated for hepatitis A.  Is treatment naive for Hep B.    PMHX, Sochx, FHx, reviewed and updated in EPIC.  Denies allergies Meds- takes prn advil. Does not take tylenol.   Review of Systems  Constitutional: Negative for appetite change and unexpected weight change.  HENT: Negative for nosebleeds.   Eyes: Negative for visual disturbance.  Respiratory: Negative for cough and shortness of breath.   Cardiovascular: Negative for chest pain.  Gastrointestinal: Negative for diarrhea, constipation and anal bleeding.  Genitourinary: Negative for difficulty urinating.  Hematological: Negative for adenopathy.  feels like food gets stuck in his throat.      Objective:   Physical Exam  Constitutional: He appears well-developed and well-nourished.  HENT:  Mouth/Throat: No oropharyngeal exudate.  Eyes: EOM are normal. Pupils are equal, round, and reactive to light.  Neck: Neck supple.  Cardiovascular: Normal rate, regular rhythm and normal heart sounds.   Pulmonary/Chest: Effort normal and breath sounds normal.  Abdominal: Soft. Bowel sounds are normal. There is no tenderness. There is no rebound.  Musculoskeletal: He exhibits no edema.  Lymphadenopathy:    He has no cervical adenopathy.      Assessment & Plan:

## 2015-04-06 NOTE — Assessment & Plan Note (Signed)
have asked him to f/u with his PCP.

## 2015-04-06 NOTE — Assessment & Plan Note (Addendum)
Encouraged to quit.  Has previously gotten Flu shot

## 2015-04-07 LAB — PROTIME-INR
INR: 0.91 (ref <1.50)
Prothrombin Time: 12.4 seconds (ref 11.6–15.2)

## 2015-04-07 LAB — HEPATITIS B CORE ANTIBODY, TOTAL: Hep B Core Total Ab: REACTIVE — AB

## 2015-04-07 LAB — HEPATITIS B SURFACE ANTIGEN: HEP B S AG: POSITIVE — AB

## 2015-04-07 LAB — HEPATITIS B SURF AG CONFIRMATION: HEPATITIS B SURFACE ANTIGEN CONFIRMATION: POSITIVE — AB

## 2015-04-07 LAB — HEPATITIS B SURFACE ANTIBODY,QUALITATIVE: Hep B S Ab: NEGATIVE

## 2015-04-08 LAB — COPPER, SERUM: Copper: 93 ug/dL (ref 70–175)

## 2015-04-12 LAB — HEPATITIS B DNA, ULTRAQUANTITATIVE, PCR
HEPATITIS B DNA (CALC): 191437 {Log_IU}/mL — AB (ref <116)
HEPATITIS B DNA: 32893 [IU]/mL — AB (ref <20)

## 2015-04-14 ENCOUNTER — Ambulatory Visit: Payer: Self-pay | Admitting: Infectious Diseases

## 2015-04-25 ENCOUNTER — Ambulatory Visit (HOSPITAL_COMMUNITY): Admission: RE | Admit: 2015-04-25 | Payer: BLUE CROSS/BLUE SHIELD | Source: Ambulatory Visit

## 2015-04-26 ENCOUNTER — Ambulatory Visit (INDEPENDENT_AMBULATORY_CARE_PROVIDER_SITE_OTHER): Payer: BLUE CROSS/BLUE SHIELD | Admitting: Infectious Diseases

## 2015-04-26 ENCOUNTER — Telehealth: Payer: Self-pay | Admitting: *Deleted

## 2015-04-26 ENCOUNTER — Encounter: Payer: Self-pay | Admitting: Infectious Diseases

## 2015-04-26 VITALS — BP 128/74 | HR 80 | Temp 98.3°F | Wt 134.0 lb

## 2015-04-26 DIAGNOSIS — B1910 Unspecified viral hepatitis B without hepatic coma: Secondary | ICD-10-CM

## 2015-04-26 MED ORDER — ENTECAVIR 0.5 MG PO TABS: 0.5000 mg | ORAL_TABLET | Freq: Every day | ORAL | Status: DC

## 2015-04-26 NOTE — Telephone Encounter (Signed)
The patient is leaving the country until 06/08/15. Dr Ninetta LightsHatcher wants him to start his medication once he returns. He also wants him to have a liver scan and a follow up appt 3 weeks after starting the medication. Have advised the patient to call us once he returns and we will get these things done for him. He advised will do so.  [facility] may need a new one if it is lost or invalid.

## 2015-04-26 NOTE — Progress Notes (Signed)
   Subjective:    Patient ID: Tyler Carter, male    DOB: 12/29/1950, 64 y.o.   MRN: [id number]  HPI 64 yo M with hx of Hep B DNA > 32k/191k, eAg+.  Was seen prior and was to have u/s however he missed this due to being out of town.  Has been feeling well- no icterus or change in color of stool or urine.  No ETOH. No tylenol.   Review of Systems  Constitutional: Negative for fever, chills, appetite change and unexpected weight change.  HENT: Negative for nosebleeds.   Gastrointestinal: Negative for diarrhea and constipation.  Genitourinary: Negative for difficulty urinating.  Neurological: Negative for headaches.       Objective:   Physical Exam  Constitutional: He appears well-developed and well-nourished.  HENT:  Mouth/Throat: No oropharyngeal exudate.  Eyes: EOM are normal. Pupils are equal, round, and reactive to light.  Neck: Neck supple.  Cardiovascular: Normal rate, regular rhythm and normal heart sounds.   Pulmonary/Chest: Effort normal and breath sounds normal.  Abdominal: Soft. Bowel sounds are normal. There is no tenderness. There is no rebound.  Lymphadenopathy:    He has no cervical adenopathy.       Assessment & Plan:

## 2015-04-26 NOTE — Assessment & Plan Note (Signed)
Will re-order his elastogram Will start him on entecavir Will aim for 1 yr of therapy to see if he converts to eAg- He will return in 6 weeks to assess his med tolerance.  i doubt there is ultility in recehcking his eAg at this point as he has most likely been infected for many, many years and the likelihood of conversion is very low.

## 2015-05-01 ENCOUNTER — Telehealth: Payer: Self-pay | Admitting: *Deleted

## 2015-05-01 NOTE — Telephone Encounter (Signed)
Called radiology and cancled his appt as he is leaving town and will not be here for the appt. The doctor is aware. He knows to call the office once he returns to reschedule the appt.

## 2015-05-23 ENCOUNTER — Ambulatory Visit (HOSPITAL_COMMUNITY): Payer: BLUE CROSS/BLUE SHIELD

## 2015-09-21 ENCOUNTER — Ambulatory Visit: Payer: Self-pay | Admitting: Infectious Diseases

## 2015-11-08 ENCOUNTER — Encounter: Payer: Self-pay | Admitting: Infectious Diseases

## 2015-11-08 ENCOUNTER — Ambulatory Visit (INDEPENDENT_AMBULATORY_CARE_PROVIDER_SITE_OTHER): Payer: BLUE CROSS/BLUE SHIELD | Admitting: Infectious Diseases

## 2015-11-08 VITALS — BP 123/73 | HR 93 | Temp 99.0°F | Ht 65.5 in | Wt 133.0 lb

## 2015-11-08 DIAGNOSIS — F17200 Nicotine dependence, unspecified, uncomplicated: Secondary | ICD-10-CM | POA: Diagnosis not present

## 2015-11-08 DIAGNOSIS — B1910 Unspecified viral hepatitis B without hepatic coma: Secondary | ICD-10-CM | POA: Diagnosis not present

## 2015-11-08 MED ORDER — TENOFOVIR DISOPROXIL FUMARATE 300 MG PO TABS: 300.0000 mg | ORAL_TABLET | Freq: Every day | ORAL | Status: DC

## 2015-11-08 MED ORDER — TENOFOVIR ALAFENAMIDE FUMARATE 25 MG PO TABS: 1.0000 | ORAL_TABLET | Freq: Every day | ORAL | Status: DC

## 2015-11-08 NOTE — Progress Notes (Addendum)
Patient ID: Tyler Carter, male   DOB: 03/16/1951, 65 y.o.   MRN: [id number] HPI: Tyler Penceanh Kroon is a 65 y.o. male who is here for his hep B f/u.   Allergies: No Known Allergies  Vitals: Temp: 99 F (37.2 C) (05/24 1614) Temp Source: Oral (05/24 1614) BP: 123/73 mmHg (05/24 1614) Pulse Rate: 93 (05/24 1614)  Past Medical History: Past Medical History  Diagnosis Date  . Hepatitis B   . Tobacco abuse     Social History: Social History   Social History  . Marital Status: Unknown    Spouse Name: N/A  . Number of Children: N/A  . Years of Education: N/A   Social History Main Topics  . Smoking status: Current Every Day Smoker -- 0.50 packs/day for 20 years    Types: Cigarettes  . Smokeless tobacco: None     Comment: would like to quit states "can not"  . Alcohol Use: No  . Drug Use: No  . Sexual Activity: Not Asked   Other Topics Concern  . None   Social History Narrative    Previous Regimen: None  Current Regimen: None  Labs: HEP B S AB (no units)  Date Value  04/06/2015 NEG   HEPATITIS B SURFACE AG (no units)  Date Value  04/06/2015 POSITIVE*   HCV AB (no units)  Date Value  09/10/2014 NEGATIVE    CrCl: CrCl cannot be calculated (Patient has no serum creatinine result on file.).  Lipids:    Component Value Date/Time   CHOL 239* 09/10/2014 1107   TRIG 108 09/10/2014 1107   HDL 51 09/10/2014 1107   CHOLHDL 4.7 09/10/2014 1107   VLDL 22 09/10/2014 1107   LDLCALC 166* 09/10/2014 1107    Assessment:  65 yo with hep B who was last seen in Nov and was prescribed Baraclude but did not picked up the med due to confusion with the language barrier and pharmacy. He is here with the translator today. He is currently working and is insured. We are going to send the pharmacy closer to his house [facility] [address] Showed it to him on [employer] [employer] and he recognized it immediately. We are going to use Viread instead since we have copay cards for it. Just in  case there is a copay to the medication. If there is a copay, he is going to get someone that can speak english to help him activated it.   Recommendations:  Dc Baraclude Start Tenofovir 300mg  PO qday  Clide CliffPham, Jhada Risk Quang, PharmD Clinical Infectious Disease Pharmacist [facility] 11/08/2015, 4:41 PM   Addendum:  Rx had some issues with filling due to the requirement for specialty pharmacy. Sort it out today and they are going to mail it to a local pharmacy here where he can pick it up. The cost of Viread and [facility] is exactly the same so, told them to [facility]. Copay card activated.  Ulyses SouthwardMinh Teri Legacy, PharmD Pager: [phone] 11/09/2015 12:02 PM

## 2015-11-08 NOTE — Assessment & Plan Note (Signed)
Will change his medication to TFV so he can use co-pay card.  [facility] [address].  rtc in 2-3 months.

## 2015-11-08 NOTE — Assessment & Plan Note (Signed)
Encouraged to quit smoking.  

## 2015-11-08 NOTE — Progress Notes (Signed)
   Subjective:    Patient ID: Tyler Carter, male    DOB: 04/11/1951, 65 y.o.   MRN: [id number]  HPI (seen with interpreter) 65 yo M with hx of Hep B DNA > 32k/191k, eAg+. He was seen November 2016 and was to start on entecavir. He never took this (missed interpretation of instructions) and is here for f/u.  Has been feeling well.  No change in color of his eyes. No change in color of BM.   Review of Systems     Objective:   Physical Exam  Constitutional: He appears well-developed and well-nourished.  Eyes: EOM are normal.  Neck: Neck supple.  Cardiovascular: Normal rate, regular rhythm and normal heart sounds.   Pulmonary/Chest: Effort normal and breath sounds normal.  Abdominal: Soft. Bowel sounds are normal. There is no tenderness. There is no rebound.  Lymphadenopathy:    He has no cervical adenopathy.      Assessment & Plan:

## 2016-02-12 ENCOUNTER — Ambulatory Visit: Payer: Self-pay | Admitting: Infectious Diseases

## 2016-03-19 ENCOUNTER — Ambulatory Visit (INDEPENDENT_AMBULATORY_CARE_PROVIDER_SITE_OTHER): Payer: BLUE CROSS/BLUE SHIELD | Admitting: Family Medicine

## 2016-03-19 VITALS — BP 116/72 | HR 82 | Temp 98.2°F | Resp 17 | Ht 65.5 in | Wt 133.0 lb

## 2016-03-19 DIAGNOSIS — J302 Other seasonal allergic rhinitis: Secondary | ICD-10-CM

## 2016-03-19 DIAGNOSIS — L409 Psoriasis, unspecified: Secondary | ICD-10-CM | POA: Diagnosis not present

## 2016-03-19 MED ORDER — BETAMETHASONE VALERATE 0.1 % EX OINT: 1.0000 "application " | TOPICAL_OINTMENT | Freq: Two times a day (BID) | CUTANEOUS | 2 refills | Status: DC

## 2016-03-19 MED ORDER — CETIRIZINE HCL 10 MG PO TABS: 10.0000 mg | ORAL_TABLET | Freq: Every day | ORAL | 11 refills | Status: DC

## 2016-03-19 MED ORDER — FLUTICASONE PROPIONATE 50 MCG/ACT NA SUSP: 2.0000 | Freq: Every day | NASAL | 11 refills | Status: DC

## 2016-03-19 NOTE — Patient Instructions (Addendum)
IF you received an x-ray today, you will receive an invoice from [facility]. Please contact [facility] at [phone] with questions or concerns regarding your invoice.   IF you received labwork today, you will receive an invoice from [facility]. Please contact [facility] at [phone] with questions or concerns regarding your invoice.   Our billing staff will not be able to assist you with questions regarding bills from these companies.  You will be contacted with the lab results as soon as they are available. The fastest way to get your results is to activate your My Chart account. Instructions are located on the last page of this paperwork. If you have not heard from us regarding the results in 2 weeks, please contact this office.     B?nh v?y n?n (Psoriasis) B?nh v?y n?n l m?t b?nh vim da ko di (m?n tnh). B?nh x?y ra v h? mi?n d?ch c?a qu v? lm cho cc t? bo da hnh thnh qu nhanh. K?t qu? l, qu nhi?u t? bo da pht tri?n v t?o thnh cc m?ng n?i ln v c mu ?? (m?ng) trng nh? mu b?c ? trn da qu v?. Cc m?ng c th? xu?t hi?n ? b?t k? ?u trn c? th?. Cc m?ng ? c th? c b?t k? hnh d?ng v kch c? no. B?nh v?y n?n c th? t? pht v t? kh?i. Tnh tr?ng b?nh thay ??i t? nh? ??n r?t n?ng. B?nh v?y n?n khng ly t? ng??i ny sang ng??i khc (khng ly nhi?m).  NGUYN NHN  Khng r nguyn nhn gy b?nh v?y n?n, nh?ng m?t s? y?u t? nh?t ??nh c th? lm tnh tr?ng b?nh n?ng h?n. Nh?ng y?u t? ny bao g?m:   T?n th??ng ho?c ch?n th??ng da, ch?ng h?n v?t c?t, v?t x??c, chy n?ng v kh da.  Thi?u nh sng m?t tr?i.  M?t s? lo?i thu?c nh?t ??nh.  R??u.  S? d?ng thu?c l.  C?ng th?ng.  Nhi?m trng do cc vi khu?n ho?c vi rt gy ra. CC Y?U T? NGUY C? Tnh tr?ng ny hay x?y ra h?n ?:  Nh?ng ng??i c ti?n s? gia ?nh b? b?nh v?y n?n.  Ng??i da tr?ng.  Nh?ng ng??i trong ?? tu?i t? 15-30 v 50-60. TRI?U CH?NG   C n?m lo?i b?nh v?y n?n khc nhau. Qu v? c th? c nhi?u h?n m?t lo?i b?nh v?y n?n trong cu?c ??i. Cc lo?i b?nh:   Th? m?ng.  Th? ??m.  Th? ??o ng??c.  Th? m?n m?.  Th? ?? da. M?i lo?i b?nh v?y n?n c cc tri?u ch?ng khc nhau.   Cc tri?u ch?ng c?a b?nh v?y n?n th? m?ng bao g?m cc m?ng ??, n?i ln v?i m?t l?p ph? (v?y) mu tr?ng b?c. Nh?ng m?ng ny c th? gy ng?a. Mng tay chn qu v? c th? c h?c lm v d? v? ho?c r?ng ra.  Cc tri?u ch?ng c?a b?nh v?y n?n th? ??m bao g?m nh?ng ??m ?? nh? th??ng xu?t hi?n ? thn mnh, cnh tay v chn. Nh?ng ??m ny c th? pht tri?n sau khi qu v? b? ?m, ??c bi?t l vim h?ng do c?u khu?n.  Cc tri?u ch?ng c?a b?nh v?y n?n th? ??o ng??c bao g?m cc m?ng ? vng nch, pha d??i ng?c, ho?c c? quan sinh d?c ngoi, hng ho?c mng.  Cc tri?u ch?ng c?a b?nh v?y n?n th? m?n m? bao g?m cc m?n nh? c m? gy ?au, ??, s?ng  trn lng bn tay ho?c lng bn chn c?a qu v?. Qu v? c?ng c th? c?m th?y ki?t s?c, s?t, y?u, ho?c m?t c?m gic ngon mi?ng.  Cc tri?u ch?ng c?a b?nh v?y n?n th? ?? da l da c mu ?? nh?t, c th? trng nh? b? b?ng. Qu v? c th? c tim ??p nhanh v thn nhi?t qu cao ho?c qu th?p. Qu v? c th? th?y ng?a ho?c ?au. CH?N ?ON  Chuyn gia ch?m [place] s?c kh?e c th? nghi ng? b?nh v?y n?n d?a trn nh?ng tri?u ch?ng v ti?n s? gia ?nh c?a qu v?. Chuyn gia ch?m [place]  s?c kh?e c?ng s? khm th?c th? cho qu v?. Vi?c ny bao g?m m?t th? thu?t l?y m?u m (sinh thi?t) ?? xt nghi?m. Qu v? c?ng c th? ???c chuy?n ??n m?t chuyn gia ch?m [place] s?c kh?e chuyn v? b?nh da li?u (bc s? da li?u).  ?I?U TR? Khng c cch ch?a tr? cho b?nh ny, nh?ng vi?c ?i?u tr? c th? gip qu?n l b?nh. M?c tiu ?i?u tr? bao g?m:   Gip da li?n l?i.  Gi?m ng?a v gi?m vim.  Lm ch?m qu trnh pht tri?n c?a cc t? bo da m?i.  Gip h? mi?n d?ch c?a qu v? ?p ?ng t?t h?n v?i da c?a qu v?. Vi?c ?i?u tr? l khc nhau, ty theo m?c ?? n?ng c?a b?nh. ?i?u tr? c  th? bao g?m:   Thu?c kem v thu?c m?.  Chi?u tia c?c tm (li?u php nh sng). Vi?c ?i?u tr? ny c th? bao g?m nh sng m?t tr?i t? nhin ho?c li?u php nh sng trong m?t phng y t?.  Thu?c (li?u php ton thn). Nh?ng thu?c ny c th? gip c? th? qu v? qu?n l t?t h?n chu trnh t? bo da v tnh tr?ng vim. Thu?c c th? ???c dng cng v?i li?u php nh sng ho?c thu?c m?. Qu v? c?ng c th? ???c cho dng khng sinh n?u qu v? b? nhi?m trng. H??NG D?N CH?M [place] T?I NH Ch?m [place] da  Lm ?m da khi c?n thi?t. Ch? dng cc ch?t lm ?m da ? ???c chuyn gia ch?m [place] s?c kh?e ch?p thu?n.   Ch??m mt vo vng b? ?nh h??ng.   Khng gi vo da.  L?i s?ng  Khng s? d?ng cc s?n ph?m thu?c l. S?n ph?m thu?c l bao g?m thu?c l d?ng ht, thu?c l d?ng nhai v thu?c l ?i?n t?. N?u qu v? c?n gip ?? ?? cai thu?c, hy h?i chuyn gia ch?m [place] s?c kh?e.  U?ng t ho?c khng u?ng r??u.   Th? cc k? thu?t gi?m c?ng th?ng, ch?ng h?n thi?n ho?c yoga.  Ti?p xc v?i nh sng m?t tr?i theo ch? d?n c?a chuyn gia ch?m [place] s?c kh?e. Khng ?? b? chy n?ng.   Cn nh?c vi?c tham gia vo m?t nhm h? tr? b?nh v?y n?n.  Thu?c  Ch? s? d?ng thu?c khng c?n k ??n v thu?c c?n k ??n theo ch? d?n c?a chuyn gia ch?m [place] s?c kh?e.  N?u qu v? ???c k thu?c khng sinh, hy dng thu?c theo ch? d?n c?a chuyn gia ch?m [place] s?c kh?e. Khng d?ng s? d?ng thu?c khng sinh ngay c? khi tnh tr?ng c?a qu v? c?i thi?n. H??ng d?n chung  Ghi nh?t k ?? gip theo di ci g lm kh?i pht c?n b?nh. C? g?ng trnh cc tc nhn kh?i pht.   G?p m?t chuyn gia t? v?n ho?c  nhn vin x h?i n?u c?m gic bu?n chn, th?t v?ng v tuy?t v?ng v? tnh tr?ng b?nh c?a qu v? ?ang ?nh h??ng ??n cng vi?c v cc m?i quan h? c?a qu v?.  Tun th? t?t c? cc cu?c h?n khm l?i theo ch? d?n c?a chuyn gia ch?m [place] s?c kh?e. ?i?u ny c vai tr quan tr?ng. ?I KHM N?U:  C?n ?au tr? nn tr?m tr?ng h?n.  Qu v? b? t?y ?? ho?c ?m  t?ng ln ? vng b? ?nh h??ng.   Qu v? b? c?n ?au ho?c c?ng kh?p m?i ? cc kh?p c?a qu v?.  Mng tay chn c?a qu v? b?t ??u v? d? dng ho?c r?ng ra kh?i chn c?a mng.   Qu v? b? s?t.   Qu v? c?m th?y tr?m c?m.   Thng tin ny khng nh?m m?c ?ch thay th? cho l?i khuyn m chuyn gia ch?m [place] s?c kh?e ni v?i qu v?. Hy b?o ??m qu v? ph?i th?o lu?n b?t k? v?n ?? g m qu v? c v?i chuyn gia ch?m [place] s?c kh?e c?a qu v?.   Document Released: 06/03/2005 Document Revised: 02/22/2015 Elsevier Interactive Patient Education 2016 Elsevier Inc.  B?nh s?t ma c? kh (Hay Fever) B?nh s?t ma c? kh l ph?n ?ng d? ?ng v?i cc h?t trong khng kh. B?nh khng ly t? ng??i sang ng??i. Khng th? ch?a kh?i h?n b?nh ny nh?ng c th? ki?m sot ???c.  NGUYN NHN B?nh s?t ma c? kh gy b?i ci g ? lm kh?i pht ph?n ?ng d? ?ng (d? nguyn). Sau ?y l nh?ng v d? v? cc d? nguyn:  C? ph?n h??ng.  Lng.  Gu c?a ??ng v?t.  C? v ph?n hoa.  Khi thu?c l.  B?i trong nh.   nhi?m. TRI?U CH?NG  H?t h?i.  Ch?y n??c m?i ho?c ng?t m?i.  Ch?y n??c m?t.  M?t, m?i, mi?ng, h?ng, da ho?c khu v?c khc b? ng?a.  ?au h?ng.  ?au ??u.  Gi?m c?m gic kh?u gic ho?c v? gic. CH?N ?ON Chuyn gia ch?m [place] s?c kh?e s? khm th?c th? v h?i v? cc tri?u ch?ng b?n ?ang c. Xt nghi?m v? d? ?ng c th? ???c th?c hi?n ?? xc ??nh chnh xc nguyn nhn kh?i pht b?nh s?t c? ma kh c?a b?n. ?I?U TR?  Thu?c khng c?n k toa c th? gip gi?m cc tri?u ch?ng. Cc thu?c ny bao g?m:  Thu?c khng histamine.  Thu?c gi?m ng?t m?i. Thu?c ny c th? gip gi?m ngh?t m?i.  N?u nh?ng thu?c khng c?n k toa khng c tc d?ng, chuyn gia ch?m [place] s?c kh?e c th? k ??n cc lo?i thu?c khc.  M?t s? ng??i h??ng l?i t? vi?c chch ng?a d? ?ng khi cc lo?i thu?c khc khng c tc d?ng. H??NG D?N CH?M [place] T?I NH  Trnh d? nguyn gy ra cc tri?u ch?ng c?a b?n, n?u c th?.  S? d?ng t?t c? thu?c theo  ch? d?n c?a chuyn gia ch?m [place] s?c kh?e. HY ?I KHM N?U:  B?n c cc tri?u ch?ng d? ?ng nghim tr?ng v cc lo?i thu?c hi?n t?i c?a b?n khng c tc d?ng.  Vi?c ?i?u tr? c lc c hi?u qu?, nh?ng by gi? b?n ?ang c cc tri?u ch?ng.  B?n b? sung huy?t v b? p l?c trong xoang.  B?n b? s?t ho?c ?au ??u.  B?n b? ch?y n??c m?i ??c.  B?n b? hen suy?n v b? ho v th? kh  kh n?ng thm. HY NGAY L?P T?C ?I KHM N?U:  B?n b? s?ng l??i ho?c mi.  B?n b? kh th?.  B?n c?m th?y chong vng ho?c c?m th?y nh? s?p ng?t.  B?n ?? m? hi l?nh.  B?n b? s?t.   Thng tin ny khng nh?m m?c ?ch thay th? cho l?i khuyn m chuyn gia ch?m [place] s?c kh?e ni v?i qu v?. Hy b?o ??m qu v? ph?i th?o lu?n b?t k? v?n ?? g m qu v? c v?i chuyn gia ch?m [place] s?c kh?e c?a qu v?.   Document Released: 06/03/2005 Document Revised: 02/03/2013 Elsevier Interactive Patient Education [employer]

## 2016-03-19 NOTE — Progress Notes (Addendum)
By signing my name below, I, Mesha Guinyard, attest that this documentation has been prepared under the direction and in the presence of Norberto SorensonEva Brailyn Delman, MD.  Electronically Signed: Arvilla MarketMesha Guinyard, Medical Scribe. 03/19/16. 8:38 AM.  Subjective:    Patient ID: Tyler Carter, male    DOB: 04/30/1951, 65 y.o.   MRN: [id number]  HPI Chief Complaint  Patient presents with  . Rash    On right leg   . sneezing    Per translator pts only complaint is that he has a lot of sneezing    HPI Comments: Tyler Carter is a 65 y.o. male who presents to the [facility] complaining of itchy rash on rt calf onset "several years". Pt's native language is [place], interpreter used [employer] translator # [phone]). Pt has been using 2 topical creams from [place] on his rash without relief to his symptoms. Pt denies noting any changes in his nails.  Sneezing: Pt has been sneezing and reports associated symptoms of fatigue, "irritated throat", rhinorrhea, watery eyes. Pt mentions he might have a "fungus" around his mouth. Pt has been using an abx from [place] for 1 week with relief to his symptoms. Pt states his throat feels better after taking it. Pt is also taking Rx for Hep B. Pt denies itchy eyes.  Patient Active Problem List   Diagnosis Date Noted  . Erectile dysfunction 04/06/2015  . Tobacco use disorder 04/06/2015  . Hyperlipidemia 10/01/2014  . Hep B w/o coma 09/10/2014   Past Medical History:  Diagnosis Date  . Hepatitis B   . Tobacco abuse    No past surgical history on file. No Known Allergies Prior to Admission medications   Medication Sig Start Date End Date Taking? Authorizing Provider  Tenofovir Alafenamide Fumarate (VEMLIDY) 25 MG TABS Take 1 tablet (25 mg total) by mouth daily. 11/08/15   Ginnie SmartJeffrey C Hatcher, MD   Social History   Social History  . Marital status: Unknown    Spouse name: N/A  . Number of children: N/A  . Years of education: N/A   Occupational History  . Not  on file.   Social History Main Topics  . Smoking status: Current Every Day Smoker    Packs/day: 0.50    Years: 20.00    Types: Cigarettes  . Smokeless tobacco: Not on file     Comment: would like to quit states "can not"  . Alcohol use No  . Drug use: No  . Sexual activity: Not on file   Other Topics Concern  . Not on file   Social History Narrative  . No narrative on file   [facility] 03/19/2016 11/08/2015 11/08/2015 04/06/2015 02/25/2015  Decreased Interest 0 1 0 0 0  Down, Depressed, Hopeless 0 1 0 0 0  PHQ - 2 Score 0 2 0 0 0  Altered sleeping - 1 - - -  Tired, decreased energy - 2 - - -  Change in appetite - 1 - - -  Feeling bad or failure about yourself  - 1 - - -  Trouble concentrating - 0 - - -  Moving slowly or fidgety/restless - 0 - - -  Suicidal thoughts - 0 - - -  PHQ-9 Score - 7 - - -  Difficult doing work/chores - Somewhat difficult - - -   Review of Systems  Constitutional: Negative for activity change, appetite change, chills, diaphoresis and fever.  HENT: Positive for congestion, rhinorrhea, sneezing and sore  throat. Negative for ear pain, facial swelling, nosebleeds, postnasal drip, sinus pressure and trouble swallowing.   Eyes: Positive for discharge (watery).  Respiratory: Negative for cough and shortness of breath.   Skin: Positive for rash.  Allergic/Immunologic: Positive for environmental allergies. Negative for immunocompromised state.  Hematological: Negative for adenopathy.   Objective:  Physical Exam  Constitutional: He appears well-developed and well-nourished. No distress.  HENT:  Head: Normocephalic and atraumatic.  Right Ear: Tympanic membrane is injected and retracted.  Left Ear: Tympanic membrane is injected and retracted.  Nares:Erythema with purulent rhinitis. Mucosal erythema Ears: Retracted and injection with effusion  Eyes: Conjunctivae and EOM are normal. Pupils are equal, round, and reactive to light.  Neck: Neck  supple. No thyromegaly present.  Cardiovascular: Normal rate, regular rhythm and normal heart sounds.  Exam reveals no gallop and no friction rub.   No murmur heard. Pulmonary/Chest: Effort normal and breath sounds normal. No respiratory distress. He has no wheezes. He has no rales.  Lymphadenopathy:    He has no cervical adenopathy.  Neurological: He is alert.  Skin: Skin is warm and dry.  4x5 cm well defined patch on his right lateral calf with silver scaling intermixed with abraded sanguineous erosions  Psychiatric: He has a normal mood and affect. His behavior is normal.  Nursing note and vitals reviewed.  BP 116/72 (BP Location: Left Arm, Patient Position: Sitting, Cuff Size: Normal)   Pulse 82   Temp 98.2 F (36.8 C) (Oral)   Resp 17   Ht 5' 5.5" (1.664 m)   Wt 133 lb (60.3 kg)   SpO2 99%   BMI 21.80 kg/m   Assessment & Plan:   1. Psoriasis on right lateral calf - start rx topical steroid of moderate potency. Use hydrating lotion when not flaired but advised will likely cont to chronically recur.  2. Acute seasonal allergic rhinitis, unspecified trigger - has been taking likely some sort of penicillin that was otc in [place] (confirmed by the translator) x 1 wk with some improvement. Start daily anti-histamine and nasal steroid.    Meds ordered this encounter  Medications  . fluticasone (FLONASE) 50 MCG/ACT nasal spray    Sig: Place 2 sprays into both nostrils at bedtime.    Dispense:  16 g    Refill:  11  . cetirizine (ZYRTEC) 10 MG tablet    Sig: Take 1 tablet (10 mg total) by mouth at bedtime.    Dispense:  30 tablet    Refill:  11  . betamethasone valerate ointment (VALISONE) 0.1 %    Sig: Apply 1 application topically 2 (two) times daily.    Dispense:  45 g    Refill:  2   Language level caveat. [place] teleinterpretor used.  I personally performed the services described in this documentation, which was scribed in my presence. The recorded information has  been reviewed and considered, and addended by me as needed.   Norberto Sorenson, M.D.  [facility] [address] [place], [place] [address] [phone] phone [phone] fax  03/19/16 4:51 PM

## 2016-04-01 ENCOUNTER — Ambulatory Visit: Payer: Self-pay | Admitting: Infectious Diseases

## 2016-04-08 ENCOUNTER — Ambulatory Visit: Payer: Self-pay | Admitting: Infectious Diseases

## 2016-04-30 ENCOUNTER — Ambulatory Visit: Payer: Self-pay | Admitting: Infectious Diseases

## 2016-06-07 HISTORY — PX: KIDNEY SURGERY: SHX687

## 2016-10-28 ENCOUNTER — Encounter: Payer: Self-pay | Admitting: Emergency Medicine

## 2016-10-28 ENCOUNTER — Ambulatory Visit (INDEPENDENT_AMBULATORY_CARE_PROVIDER_SITE_OTHER): Payer: BLUE CROSS/BLUE SHIELD | Admitting: Emergency Medicine

## 2016-10-28 VITALS — BP 110/72 | HR 84 | Temp 98.0°F | Resp 17 | Ht 65.0 in | Wt 138.0 lb

## 2016-10-28 DIAGNOSIS — Z0000 Encounter for general adult medical examination without abnormal findings: Secondary | ICD-10-CM | POA: Diagnosis not present

## 2016-10-28 NOTE — Patient Instructions (Addendum)
   IF you received an x-ray today, you will receive an invoice from [facility]. Please contact [facility] at [phone] with questions or concerns regarding your invoice.   IF you received labwork today, you will receive an invoice from [facility]. Please contact [facility] at [phone] with questions or concerns regarding your invoice.   Our billing staff will not be able to assist you with questions regarding bills from these companies.  You will be contacted with the lab results as soon as they are available. The fastest way to get your results is to activate your My Chart account. Instructions are located on the last page of this paperwork. If you have not heard from us regarding the results in 2 weeks, please contact this office.        Health Maintenance, Male A healthy lifestyle and preventive care is important for your health and wellness. Ask your health care provider about what schedule of regular examinations is right for you. What should I know about weight and diet?  Eat a Healthy Diet  Eat plenty of vegetables, fruits, whole grains, low-fat dairy products, and lean protein.  Do not eat a lot of foods high in solid fats, added sugars, or salt. Maintain a Healthy Weight  Regular exercise can help you achieve or maintain a healthy weight. You should:  Do at least 150 minutes of exercise each week. The exercise should increase your heart rate and make you sweat (moderate-intensity exercise).  Do strength-training exercises at least twice a week. Watch Your Levels of Cholesterol and Blood Lipids  Have your blood tested for lipids and cholesterol every 5 years starting at 65 years of age. If you are at high risk for heart disease, you should start having your blood tested when you are 65 years old. You may need to have your cholesterol levels checked more often if:  Your lipid or cholesterol levels are high.  You are older than 65 years of  age.  You are at high risk for heart disease. What should I know about cancer screening? Many types of cancers can be detected early and may often be prevented. Lung Cancer  You should be screened every year for lung cancer if:  You are a current smoker who has smoked for at least 30 years.  You are a former smoker who has quit within the past 15 years.  Talk to your health care provider about your screening options, when you should start screening, and how often you should be screened. Colorectal Cancer  Routine colorectal cancer screening usually begins at 65 years of age and should be repeated every 5-10 years until you are 65 years old. You may need to be screened more often if early forms of precancerous polyps or small growths are found. Your health care provider may recommend screening at an earlier age if you have risk factors for colon cancer.  Your health care provider may recommend using home test kits to check for hidden blood in the stool.  A small camera at the end of a tube can be used to examine your colon (sigmoidoscopy or colonoscopy). This checks for the earliest forms of colorectal cancer. Prostate and Testicular Cancer  Depending on your age and overall health, your health care provider may do certain tests to screen for prostate and testicular cancer.  Talk to your health care provider about any symptoms or concerns you have about testicular or prostate cancer. Skin Cancer  Check your skin from head   to toe regularly.  Tell your health care provider about any new moles or changes in moles, especially if:  There is a change in a mole's size, shape, or color.  You have a mole that is larger than a pencil eraser.  Always use sunscreen. Apply sunscreen liberally and repeat throughout the day.  Protect yourself by wearing long sleeves, pants, a wide-brimmed hat, and sunglasses when outside. What should I know about heart disease, diabetes, and high blood  pressure?  If you are 18-39 years of age, have your blood pressure checked every 3-5 years. If you are 40 years of age or older, have your blood pressure checked every year. You should have your blood pressure measured twice-once when you are at a hospital or clinic, and once when you are not at a hospital or clinic. Record the average of the two measurements. To check your blood pressure when you are not at a hospital or clinic, you can use:  An automated blood pressure machine at a pharmacy.  A home blood pressure monitor.  Talk to your health care provider about your target blood pressure.  If you are between 45-79 years old, ask your health care provider if you should take aspirin to prevent heart disease.  Have regular diabetes screenings by checking your fasting blood sugar level.  If you are at a normal weight and have a low risk for diabetes, have this test once every three years after the age of 45.  If you are overweight and have a high risk for diabetes, consider being tested at a younger age or more often.  A one-time screening for abdominal aortic aneurysm (AAA) by ultrasound is recommended for men aged 65-75 years who are current or former smokers. What should I know about preventing infection? Hepatitis B  If you have a higher risk for hepatitis B, you should be screened for this virus. Talk with your health care provider to find out if you are at risk for hepatitis B infection. Hepatitis C  Blood testing is recommended for:  Everyone born from 1945 through 1965.  Anyone with known risk factors for hepatitis C. Sexually Transmitted Diseases (STDs)  You should be screened each year for STDs including gonorrhea and chlamydia if:  You are sexually active and are younger than 65 years of age.  You are older than 65 years of age and your health care provider tells you that you are at risk for this type of infection.  Your sexual activity has changed since you were last  screened and you are at an increased risk for chlamydia or gonorrhea. Ask your health care provider if you are at risk.  Talk with your health care provider about whether you are at high risk of being infected with HIV. Your health care provider may recommend a prescription medicine to help prevent HIV infection. What else can I do?  Schedule regular health, dental, and eye exams.  Stay current with your vaccines (immunizations).  Do not use any tobacco products, such as cigarettes, chewing tobacco, and e-cigarettes. If you need help quitting, ask your health care provider.  Limit alcohol intake to no more than 2 drinks per day. One drink equals 12 ounces of beer, 5 ounces of wine, or 1 ounces of hard liquor.  Do not use street drugs.  Do not share needles.  Ask your health care provider for help if you need support or information about quitting drugs.  Tell your health care provider if you   often feel depressed.  Tell your health care provider if you have ever been abused or do not feel safe at home. This information is not intended to replace advice given to you by your health care provider. Make sure you discuss any questions you have with your health care provider. Document Released: 11/30/2007 Document Revised: 01/31/2016 Document Reviewed: 03/07/2015 Elsevier Interactive Patient Education  2017 Elsevier Inc.  American Heart Association (AHA) Exercise Recommendation  Being physically active is important to prevent heart disease and stroke, the nation's No. 1and No. 5killers. To improve overall cardiovascular health, we suggest at least 150 minutes per week of moderate exercise or 75 minutes per week of vigorous exercise (or a combination of moderate and vigorous activity). Thirty minutes a day, five times a week is an easy goal to remember. You will also experience benefits even if you divide your time into two or three segments of 10 to 15 minutes per day.  For people who would  benefit from lowering their blood pressure or cholesterol, we recommend 40 minutes of aerobic exercise of moderate to vigorous intensity three to four times a week to lower the risk for heart attack and stroke.  Physical activity is anything that makes you move your body and burn calories.  This includes things like climbing stairs or playing sports. Aerobic exercises benefit your heart, and include walking, jogging, swimming or biking. Strength and stretching exercises are best for overall stamina and flexibility.  The simplest, positive change you can make to effectively improve your heart health is to start walking. It's enjoyable, free, easy, social and great exercise. A walking program is flexible and boasts high success rates because people can stick with it. It's easy for walking to become a regular and satisfying part of life.   For Overall Cardiovascular Health:  At least 30 minutes of moderate-intensity aerobic activity at least 5 days per week for a total of 150  OR   At least 25 minutes of vigorous aerobic activity at least 3 days per week for a total of 75 minutes; or a combination of moderate- and vigorous-intensity aerobic activity  AND   Moderate- to high-intensity muscle-strengthening activity at least 2 days per week for additional health benefits.  For Lowering Blood Pressure and Cholesterol  An average 40 minutes of moderate- to vigorous-intensity aerobic activity 3 or 4 times per week  What if I can't make it to the time goal? Something is always better than nothing! And everyone has to start somewhere. Even if you've been sedentary for years, today is the day you can begin to make healthy changes in your life. If you don't think you'll make it for 30 or 40 minutes, set a reachable goal for today. You can work up toward your overall goal by increasing your time as you get stronger. Don't let all-or-nothing thinking rob you of doing what you can every day.   Source:http://www.heart.org    

## 2016-10-28 NOTE — Progress Notes (Signed)
Tyler Carter 66 y.o.   Chief Complaint  Patient presents with  . Annual Exam    HISTORY OF PRESENT ILLNESS: This is a 66 y.o. male here for annual exam; son translating; no complaints or medical concerns.  HPI   Prior to Admission medications   Medication Sig Start Date End Date Taking? Authorizing Provider  betamethasone valerate ointment (VALISONE) 0.1 % Apply 1 application topically 2 (two) times daily. 03/19/16   Sherren Mocha, MD  cetirizine (ZYRTEC) 10 MG tablet Take 1 tablet (10 mg total) by mouth at bedtime. 03/19/16   Sherren Mocha, MD  fluticasone (FLONASE) 50 MCG/ACT nasal spray Place 2 sprays into both nostrils at bedtime. 03/19/16   Sherren Mocha, MD  Tenofovir Alafenamide Fumarate (VEMLIDY) 25 MG TABS Take 1 tablet (25 mg total) by mouth daily. 11/08/15   Ginnie Smart, MD    No Known Allergies  Patient Active Problem List   Diagnosis Date Noted  . Erectile dysfunction 04/06/2015  . Tobacco use disorder 04/06/2015  . Hyperlipidemia 10/01/2014  . Hep B w/o coma 09/10/2014    Past Medical History:  Diagnosis Date  . Chronic kidney disease   . Hepatitis B   . Tobacco abuse     Past Surgical History:  Procedure Laterality Date  . KIDNEY SURGERY Right 06/07/2016   partial nephrectomy    Social History   Social History  . Marital status: Married    Spouse name: N/A  . Number of children: N/A  . Years of education: N/A   Occupational History  . Not on file.   Social History Main Topics  . Smoking status: Current Every Day Smoker    Packs/day: 0.50    Years: 20.00    Types: Cigarettes  . Smokeless tobacco: Never Used     Comment: would like to quit states "can not"  . Alcohol use No  . Drug use: No  . Sexual activity: Not on file   Other Topics Concern  . Not on file   Social History Narrative  . No narrative on file    Family History  Problem Relation Age of Onset  . Cancer Father        throat cancer     Review of Systems    Constitutional: Negative.  Negative for fever, malaise/fatigue and weight loss.  HENT: Negative.  Negative for ear pain, nosebleeds and sore throat.   Eyes: Negative.  Negative for blurred vision, double vision, pain and discharge.  Respiratory: Negative.  Negative for cough and shortness of breath.   Cardiovascular: Negative.  Negative for chest pain, palpitations and leg swelling.  Gastrointestinal: Negative.  Negative for abdominal pain, diarrhea, nausea and vomiting.  Genitourinary: Negative.  Negative for dysuria and hematuria.  Musculoskeletal: Negative.  Negative for back pain, myalgias and neck pain.  Skin: Negative.  Negative for rash.  Neurological: Negative.  Negative for dizziness, sensory change, focal weakness, seizures, loss of consciousness and headaches.  Endo/Heme/Allergies: Negative.   All other systems reviewed and are negative.  Vitals:   10/28/16 0812 10/28/16 0909  BP: (!) 147/74 110/72  Pulse: 84   Resp: 17   Temp: 98 F (36.7 C)      Physical Exam  Constitutional: He is oriented to person, place, and time. He appears well-developed and well-nourished.  HENT:  Head: Normocephalic and atraumatic.  Right Ear: External ear normal.  Left Ear: External ear normal.  Nose: Nose normal.  Mouth/Throat: Oropharynx is clear and  moist. No oropharyngeal exudate.  Eyes: Conjunctivae and EOM are normal. Pupils are equal, round, and reactive to light.  Neck: Normal range of motion. Neck supple. No JVD present. Carotid bruit is not present. No thyromegaly present.  Cardiovascular: Normal rate, regular rhythm, normal heart sounds and intact distal pulses.   Pulmonary/Chest: Effort normal and breath sounds normal.  Abdominal: Soft. Bowel sounds are normal. He exhibits no distension. There is no tenderness.  Musculoskeletal: Normal range of motion.  Lymphadenopathy:    He has no cervical adenopathy.  Neurological: He is alert and oriented to person, place, and time. He  displays normal reflexes. No sensory deficit. He exhibits normal muscle tone. Coordination normal.  Skin: Skin is warm and dry. No rash noted.  Psychiatric: He has a normal mood and affect. His behavior is normal.  Vitals reviewed.    ASSESSMENT & PLAN: Kayler was seen today for annual exam.  Diagnoses and all orders for this visit:  Routine general medical examination at a health care facility -     CBC with Differential -     Comprehensive metabolic panel -     Hemoglobin A1c -     Lipid panel -     PSA(Must document that pt has been informed of limitations of PSA testing.) -     TSH -     HIV antibody    Patient Instructions       IF you received an x-ray today, you will receive an invoice from [facility]. Please contact [facility] at [phone] with questions or concerns regarding your invoice.   IF you received labwork today, you will receive an invoice from [facility]. Please contact [facility] at [phone] with questions or concerns regarding your invoice.   Our billing staff will not be able to assist you with questions regarding bills from these companies.  You will be contacted with the lab results as soon as they are available. The fastest way to get your results is to activate your My Chart account. Instructions are located on the last page of this paperwork. If you have not heard from [place] regarding the results in 2 weeks, please contact this office.        Health Maintenance, Male A healthy lifestyle and preventive care is important for your health and wellness. Ask your health care provider about what schedule of regular examinations is right for you. What should I know about weight and diet?  Eat a Healthy Diet  Eat plenty of vegetables, fruits, whole grains, low-fat dairy products, and lean protein.  Do not eat a lot of foods high in solid fats, added sugars, or salt. Maintain a Healthy Weight  Regular exercise can help you achieve  or maintain a healthy weight. You should:  Do at least 150 minutes of exercise each week. The exercise should increase your heart rate and make you sweat (moderate-intensity exercise).  Do strength-training exercises at least twice a week. Watch Your Levels of Cholesterol and Blood Lipids  Have your blood tested for lipids and cholesterol every 5 years starting at 65 years of age. If you are at high risk for heart disease, you should start having your blood tested when you are 65 years old. You may need to have your cholesterol levels checked more often if:  Your lipid or cholesterol levels are high.  You are older than 65 years of age.  You are at high risk for heart disease. What should I know about cancer screening? Many  types of cancers can be detected early and may often be prevented. Lung Cancer  You should be screened every year for lung cancer if:  You are a current smoker who has smoked for at least 30 years.  You are a former smoker who has quit within the past 15 years.  Talk to your health care provider about your screening options, when you should start screening, and how often you should be screened. Colorectal Cancer  Routine colorectal cancer screening usually begins at 65 years of age and should be repeated every 5-10 years until you are 65 years old. You may need to be screened more often if early forms of precancerous polyps or small growths are found. Your health care provider may recommend screening at an earlier age if you have risk factors for colon cancer.  Your health care provider may recommend using home test kits to check for hidden blood in the stool.  A small camera at the end of a tube can be used to examine your colon (sigmoidoscopy or colonoscopy). This checks for the earliest forms of colorectal cancer. Prostate and Testicular Cancer  Depending on your age and overall health, your health care provider may do certain tests to screen for prostate and  testicular cancer.  Talk to your health care provider about any symptoms or concerns you have about testicular or prostate cancer. Skin Cancer  Check your skin from head to toe regularly.  Tell your health care provider about any new moles or changes in moles, especially if:  There is a change in a mole's size, shape, or color.  You have a mole that is larger than a pencil eraser.  Always use sunscreen. Apply sunscreen liberally and repeat throughout the day.  Protect yourself by wearing long sleeves, pants, a wide-brimmed hat, and sunglasses when outside. What should I know about heart disease, diabetes, and high blood pressure?  If you are 35-1 years of age, have your blood pressure checked every 3-5 years. If you are 37 years of age or older, have your blood pressure checked every year. You should have your blood pressure measured twice-once when you are at a hospital or clinic, and once when you are not at a hospital or clinic. Record the average of the two measurements. To check your blood pressure when you are not at a hospital or clinic, you can use:  An automated blood pressure machine at a pharmacy.  A home blood pressure monitor.  Talk to your health care provider about your target blood pressure.  If you are between 71-34 years old, ask your health care provider if you should take aspirin to prevent heart disease.  Have regular diabetes screenings by checking your fasting blood sugar level.  If you are at a normal weight and have a low risk for diabetes, have this test once every three years after the age of 71.  If you are overweight and have a high risk for diabetes, consider being tested at a younger age or more often.  A one-time screening for abdominal aortic aneurysm (AAA) by ultrasound is recommended for men aged 65-75 years who are current or former smokers. What should I know about preventing infection? Hepatitis B  If you have a higher risk for hepatitis B,  you should be screened for this virus. Talk with your health care provider to find out if you are at risk for hepatitis B infection. Hepatitis C  Blood testing is recommended for:  Everyone born from  1944 through 79.  Anyone with known risk factors for hepatitis C. Sexually Transmitted Diseases (STDs)  You should be screened each year for STDs including gonorrhea and chlamydia if:  You are sexually active and are younger than 65 years of age.  You are older than 65 years of age and your health care provider tells you that you are at risk for this type of infection.  Your sexual activity has changed since you were last screened and you are at an increased risk for chlamydia or gonorrhea. Ask your health care provider if you are at risk.  Talk with your health care provider about whether you are at high risk of being infected with HIV. Your health care provider may recommend a prescription medicine to help prevent HIV infection. What else can I do?  Schedule regular health, dental, and eye exams.  Stay current with your vaccines (immunizations).  Do not use any tobacco products, such as cigarettes, chewing tobacco, and e-cigarettes. If you need help quitting, ask your health care provider.  Limit alcohol intake to no more than 2 drinks per day. One drink equals 12 ounces of beer, 5 ounces of wine, or 1 ounces of hard liquor.  Do not use street drugs.  Do not share needles.  Ask your health care provider for help if you need support or information about quitting drugs.  Tell your health care provider if you often feel depressed.  Tell your health care provider if you have ever been abused or do not feel safe at home. This information is not intended to replace advice given to you by your health care provider. Make sure you discuss any questions you have with your health care provider. Document Released: 11/30/2007 Document Revised: 01/31/2016 Document Reviewed:  03/07/2015 Elsevier Interactive Patient Education  2017 Elsevier Inc.  American Heart Association [facility]) Exercise Recommendation  Being physically active is important to prevent heart disease and stroke, the nation's No. 1and No. 5killers. To improve overall cardiovascular health, we suggest at least 150 minutes per week of moderate exercise or 75 minutes per week of vigorous exercise (or a combination of moderate and vigorous activity). Thirty minutes a day, five times a week is an easy goal to remember. You will also experience benefits even if you divide your time into two or three segments of 10 to 15 minutes per day.  For people who would benefit from lowering their blood pressure or cholesterol, we recommend 40 minutes of aerobic exercise of moderate to vigorous intensity three to four times a week to lower the risk for heart attack and stroke.  Physical activity is anything that makes you move your body and burn calories.  This includes things like climbing stairs or playing sports. Aerobic exercises benefit your heart, and include walking, jogging, swimming or biking. Strength and stretching exercises are best for overall stamina and flexibility.  The simplest, positive change you can make to effectively improve your heart health is to start walking. It's enjoyable, free, easy, social and great exercise. A walking program is flexible and boasts high success rates because people can stick with it. It's easy for walking to become a regular and satisfying part of life.   For Overall Cardiovascular Health:  At least 30 minutes of moderate-intensity aerobic activity at least 5 days per week for a total of 150  OR   At least 25 minutes of vigorous aerobic activity at least 3 days per week for a total of 75 minutes; or a  combination of moderate- and vigorous-intensity aerobic activity  AND   Moderate- to high-intensity muscle-strengthening activity at least 2 days per week for additional  health benefits.  For Lowering Blood Pressure and Cholesterol  An average 40 minutes of moderate- to vigorous-intensity aerobic activity 3 or 4 times per week  What if I can't make it to the time goal? Something is always better than nothing! And everyone has to start somewhere. Even if you've been sedentary for years, today is the day you can begin to make healthy changes in your life. If you don't think you'll make it for 30 or 40 minutes, set a reachable goal for today. You can work up toward your overall goal by increasing your time as you get stronger. Don't let all-or-nothing thinking rob you of doing what you can every day.  Source:http://www.heart.[place]        Keir Foland, MD [facility]

## 2016-10-29 LAB — CBC WITH DIFFERENTIAL/PLATELET
BASOS ABS: 0 10*3/uL (ref 0.0–0.2)
Basos: 0 %
EOS (ABSOLUTE): 0.3 10*3/uL (ref 0.0–0.4)
EOS: 3 %
HEMATOCRIT: 48.7 % (ref 37.5–51.0)
Hemoglobin: 16 g/dL (ref 13.0–17.7)
IMMATURE GRANULOCYTES: 0 %
Immature Grans (Abs): 0 10*3/uL (ref 0.0–0.1)
LYMPHS ABS: 2.3 10*3/uL (ref 0.7–3.1)
Lymphs: 31 %
MCH: 28.9 pg (ref 26.6–33.0)
MCHC: 32.9 g/dL (ref 31.5–35.7)
MCV: 88 fL (ref 79–97)
MONOCYTES: 6 %
MONOS ABS: 0.4 10*3/uL (ref 0.1–0.9)
Neutrophils Absolute: 4.5 10*3/uL (ref 1.4–7.0)
Neutrophils: 60 %
Platelets: 220 10*3/uL (ref 150–379)
RBC: 5.54 x10E6/uL (ref 4.14–5.80)
RDW: 14.4 % (ref 12.3–15.4)
WBC: 7.6 10*3/uL (ref 3.4–10.8)

## 2016-10-29 LAB — COMPREHENSIVE METABOLIC PANEL
ALT: 43 IU/L (ref 0–44)
AST: 31 IU/L (ref 0–40)
Albumin/Globulin Ratio: 1.5 (ref 1.2–2.2)
Albumin: 4.7 g/dL (ref 3.6–4.8)
Alkaline Phosphatase: 89 IU/L (ref 39–117)
BILIRUBIN TOTAL: 0.4 mg/dL (ref 0.0–1.2)
BUN / CREAT RATIO: 19 (ref 10–24)
BUN: 15 mg/dL (ref 8–27)
CHLORIDE: 101 mmol/L (ref 96–106)
CO2: 21 mmol/L (ref 18–29)
Calcium: 9.4 mg/dL (ref 8.6–10.2)
Creatinine, Ser: 0.78 mg/dL (ref 0.76–1.27)
GFR, EST AFRICAN AMERICAN: 110 mL/min/{1.73_m2} (ref >59)
GFR, EST NON AFRICAN AMERICAN: 95 mL/min/{1.73_m2} (ref >59)
GLUCOSE: 101 mg/dL — AB (ref 65–99)
Globulin, Total: 3.1 g/dL (ref 1.5–4.5)
POTASSIUM: 4.1 mmol/L (ref 3.5–5.2)
SODIUM: 143 mmol/L (ref 134–144)
TOTAL PROTEIN: 7.8 g/dL (ref 6.0–8.5)

## 2016-10-29 LAB — HEMOGLOBIN A1C
Est. average glucose Bld gHb Est-mCnc: 114 mg/dL
Hgb A1c MFr Bld: 5.6 % (ref 4.8–5.6)

## 2016-10-29 LAB — LIPID PANEL
CHOL/HDL RATIO: 5.8 ratio — AB (ref 0.0–5.0)
CHOLESTEROL TOTAL: 243 mg/dL — AB (ref 100–199)
HDL: 42 mg/dL (ref >39)
LDL CALC: 162 mg/dL — AB (ref 0–99)
Triglycerides: 195 mg/dL — ABNORMAL HIGH (ref 0–149)
VLDL CHOLESTEROL CAL: 39 mg/dL (ref 5–40)

## 2016-10-29 LAB — PSA: Prostate Specific Ag, Serum: 1.9 ng/mL (ref 0.0–4.0)

## 2016-10-29 LAB — TSH: TSH: 1.53 u[IU]/mL (ref 0.450–4.500)

## 2016-10-29 LAB — HIV ANTIBODY (ROUTINE TESTING W REFLEX): HIV SCREEN 4TH GENERATION: NONREACTIVE

## 2016-10-31 ENCOUNTER — Encounter: Payer: Self-pay | Admitting: Radiology

## 2016-11-07 ENCOUNTER — Inpatient Hospital Stay: Payer: BLUE CROSS/BLUE SHIELD | Admitting: Emergency Medicine

## 2016-11-12 ENCOUNTER — Ambulatory Visit (INDEPENDENT_AMBULATORY_CARE_PROVIDER_SITE_OTHER): Payer: BLUE CROSS/BLUE SHIELD | Admitting: Emergency Medicine

## 2016-11-12 ENCOUNTER — Encounter: Payer: Self-pay | Admitting: Emergency Medicine

## 2016-11-12 VITALS — BP 140/70 | HR 89 | Temp 98.1°F | Resp 16 | Ht 65.16 in | Wt 138.8 lb

## 2016-11-12 DIAGNOSIS — E785 Hyperlipidemia, unspecified: Secondary | ICD-10-CM | POA: Diagnosis not present

## 2016-11-12 DIAGNOSIS — F17200 Nicotine dependence, unspecified, uncomplicated: Secondary | ICD-10-CM

## 2016-11-12 DIAGNOSIS — Z8719 Personal history of other diseases of the digestive system: Secondary | ICD-10-CM | POA: Diagnosis not present

## 2016-11-12 DIAGNOSIS — Z85528 Personal history of other malignant neoplasm of kidney: Secondary | ICD-10-CM | POA: Diagnosis not present

## 2016-11-12 DIAGNOSIS — Z0000 Encounter for general adult medical examination without abnormal findings: Secondary | ICD-10-CM | POA: Diagnosis not present

## 2016-11-12 DIAGNOSIS — Z905 Acquired absence of kidney: Secondary | ICD-10-CM

## 2016-11-12 MED ORDER — PRAVASTATIN SODIUM 40 MG PO TABS: 40.0000 mg | ORAL_TABLET | Freq: Every day | ORAL | 3 refills | Status: DC

## 2016-11-12 NOTE — Patient Instructions (Addendum)
IF you received an x-ray today, you will receive an invoice from [facility]. Please contact [facility] at [phone] with questions or concerns regarding your invoice.   IF you received labwork today, you will receive an invoice from [place]. Please contact [facility] at [phone] with questions or concerns regarding your invoice.   Our billing staff will not be able to assist you with questions regarding bills from these companies.  You will be contacted with the lab results as soon as they are available. The fastest way to get your results is to activate your My Chart account. Instructions are located on the last page of this paperwork. If you have not heard from [place] regarding the results in 2 weeks, please contact this office.     R?i loa?n m?? ma?u Dyslipidemia R?i lo?n m? mu l tnh tr?ng m?t cn b?ng cc ch?t gi?ng nh? sp, m? (ch?t bo) trong mu. C? th? chng ta c?n c l??ng nh? ch?t bo. R?i lo?n m? mu th??ng ko theo n?ng ?? cc lo?i ch?t bo cao l cholesterol ho?c triglycerides. Cc d?ng r?i loa?n m?? ma?u th??ng g?p bao g?m:  N?ng ?? cholesterol x?u (LDL cholesterol) cao. LDL l lo?i cholesterol gy ra cc c?n ch?t bo (m?ng bm) tch t? trong m?ch mu mang mu ra kh?i tim qu v? (??ng m?ch).  N?ng ?? cholesterol t?t (HDL cholesterol) th?p. HDL cholesterol l lo?i cholesterol b?o v? ch?ng l?i b?nh tim. N?ng ?? HDL cao s? lo?i b? m?ng bm LDL ra kh?i ??ng m?ch.  N?ng ?? triglycerides cao. Triglycerides la? ch?t be?o trong ma?u lin quan ??n s? ti?ch tu? ma?ng ba?m trong ??ng ma?ch. Qu v? c th? pht tri?n b?nh r?i lo?n m? mu do mang cc gen di truy?n (r?i lo?n m? mu nguyn pht) ho?c do cc thay ??i x?y ra trong cu?c s?ng (r?i lo?n m? mu th? pht) ho?c ?y l tc d?ng ph? c?a m?t s? ph??ng php ?i?u tr? y khoa nh?t ??nh. Nguyn nhn g gy ra? R?i lo?n m? mu nguyn pht l do cc thay ??i (??t bi?n) trong gen ???c truy?n qua cc th? h?  trong gia ?nh (di truy?n). Nh?ng ??t bi?n ny gy ra nhi?u lo?i r?i lo?n m? mu. ??t bi?n c th? ??a ??n cc r?i lo?n khi?n c? th? s?n xu?t qu nhi?u LDL cholesterol ho?c triglycerides ho?c s?n xu?t khng ?? HDL cholesterol. Nh?ng r?i lo?n ny c th? d?n ??n b?nh tim, b?nh ??ng m?ch ho?c ??t qu? khi tu?i cn tr?. Cc nguyn nhn gy r?i lo?n m? mu th? pht bao g?m m?t s? l?a ch?n v? l?i s?ng v b?nh l nh?t ??nh d?n ??n r?i lo?n m? mu, nh? l:  ?n ch? ?? ?n nhi?u m? ??ng v?t.  Khng ho?t ??ng ho?c khng t?p th? d?c ?? (c l?i s?ng t v?n ??ng).  B? b?nh ti?u ???ng, b?nh th?n, b?nh gan ho?c b?nh tuy?n gip.  U?ng nhi?u r??u.  S? d?ng m?t s? lo?i thu?c nh?t ??nh. ?i?u g lm t?ng nguy c?? Qu v? c th? c nguy c? b? r?i lo?n m? mu cao h?n n?u qu v? l nam gi?i l?n tu?i ho?c n?u qu v? l n? gi?i ? tr?i qua th?i k? mn kinh. Cc y?u t? nguy c? khc bao g?m:  C ti?n s? gia ?nh b? r?i lo?n m? mu.  ?ang dng m?t s? loa?i thu?c no ?, bao g?m vin thu?c tra?nh thai, steroid, m?t s? thu?c l?i ti?u, thu?c  che?n beta va? m?t s? loa?i thu?c ?i?u tr? HIV.  Ht thu?c l.  ?n ch? ?? ?n nhi?u m??.  U?ng nhi?u r??u.  C m?t s? ti?nh tra?ng b?nh ly? nh? ti?u ????ng, h?i ch??ng bu?ng tr??ng ?a nang (PCOS), mang thai, b?nh th?n, b?nh gan ho?c suy gia?p.  Khng t?p th? d?c th??ng xuyn.  B? th?a cn ho?c bo ph v?i m? b?ng qu nhi?u. Cc d?u hi?u v tri?u ch?ng l g? R?i lo?n m? mu th??ng khng gy ra b?t k? tri?u ch?ng no. N?ng ?? ch?t bo r?t cao c th? gy ra cc b??u m? d??i da (u vng) ho?c m?t vng trn mu tr?ng ho?c xm xung quanh tm mu ?en (??ng t?) c?a m?t. N?ng ?? triglyceride r?t cao c th? gy vim tuy?n t?y (vim t?y). Ch?n ?on tnh tr?ng ny nh? th? no? Chuyn gia ch?m [place] s?c kh?e c th? ch?n ?on r?i lo?n m? mu d?a trn m?t xt nghi?m mu thng th??ng (xt nghi?m mu lc ?i). Do h?u h?t m?i ng??i khng c tri?u ch?ng c?a tnh tr?ng ny, xt nghi?m  mu ny (h? s? ch?t bo) ???c th?c hi?n ? ng??i tr??ng thnh t? 20 tu?i tr? ln v ???c l?p l?i 5 n?m m?t l?n. Xt nghi?m ny ki?m tra:  Cholesterol ton ph?n. ?y l m?t th??c ?o t?ng l??ng cholesterol trong mu c?a qu v?, bao g?m LDL cholesterol, HDL cholesterol v triglycerides. Con s? c?a ng??i kh?e m?nh l d??i 200.  LDL cholesterol. S? mu?c tiu cu?a LDL cholesterol ?? m?i ng???i khc nhau, tu?y thu?c va?o ca?c y?u t? nguy c? c nhn. ??i v?i h?u h?t m?i ng??i, s? d??i 100 l kh?e m?nh. Hy h?i chuyn gia ch?m [place] s?c kh?e c?a quy? vi? xem s? LDL cholesterol c?a quy? vi? nn la? bao nhiu.  HDL cholesterol. M?c HDL t? 60 tr? ln l t?t nh?t v n gip b?o v? ch?ng l?i b?nh tim. S? d??i 40 ??i v?i nam gi?i ho?c d??i 50 ??i v?i n? gi?i lm t?ng nguy c? b? b?nh tim.  Triglycerides. S? triglycecide ? ng??i kh?e m?nh l d???i 150. N?u h? s? ch?t bo c?a qu v? b?t th??ng, chuyn gia ch?m [place] s?c kh?e c th? th?c hi?n cc xt nghi?m mu khc ?? c thm thng tin v? tnh tr?ng c?a qu v?. Tnh tr?ng ny ???c ?i?u tr? nh? th? no? ?i?u tr? ph? thu?c vo lo?i r?i lo?n ch?t bo m qu v? b? v cc y?u t? nguy c? khc ??i v?i b?nh tim v ??t qu?[place] Chuyn gia ch?m [place] s?c kh?e s? c m?t kho?ng m?c tiu cho m?c ch?t bo c?a qu v? d?a trn thng tin ny. ??i v?i nhi?u ng??i, ?i?u tr? b?t ??u b?ng vi?c thay ??i l?i s?ng, nh? l ch? ?? ?n v t?p th? d?c. Chuyn gia ch?m [place] s?c kh?e c th? khuy?n ngh? r?ng qu v?:  T?p th? d?c th??ng xuyn.  Thay ??i ch? ?? ?n.  B? thu?c l, n?u qu v? ht thu?c. N?u thay ??i ch? ?? ?n v t?p th? d?c khng gip qu v? ??t ???c m?c tiu c?a mnh, chuyn gia ch?m [place] s?c kh?e c?ng c th? k ??n thu?c ?? lm gi?m ch?t bo. Lo?i thu?c ???c k ??n th??ng xuyn nh?t s? lm gi?m LDL cholesterol (thu?c statin). N?u qu v? c n?ng ?? triglyceride cao, chuyn gia c th? k ??n m?t lo?i thu?c khc (fibrate) ho?c th?c ph?m b? sung d?u c omega-3 ho?c  c? hai. Tun th? nh?ng h??ng  d?n ny ? nh:  Ch? s? d?ng thu?c khng k ??n v thu?c k ??n theo ch? d?n c?a chuyn gia ch?m [place] s?c kh?e. ?i?u ny bao g?m cc th?c ph?m b? sung.  T?p th? d?c th??ng xuyn. B??t ??u m?t ch??ng tri?nh t?p th?m m? v luy?n t?p t?ng s?c m?nh c? b?p theo chi? d?n cu?a chuyn gia ch?m so?c s??c kho?e. H?i chuyn gia ch?m [place] s?c kh?e v? cc ho?t ??ng no an ton cho qu v?. Chuyn gia ch?m [place] s?c kh?e c?a qu v? c th? khuy?n ngh?:  Ho?t ??ng th? d?c th?m m? 30 pht m?i ngy, 4-6 ngy m?i tu?n. ?i b? nhanh l m?t v d? v? ho?t ??ng th? d?c th?m m?.  Luy?n t?p t?ng s?c m?nh c? b?p 2 ngy m?i tu?n.  ?n ch? ?? ?n lnh m?nh theo ch? d?n c?a chuyn gia ch?m [place] s?c kh?e c?a qu v?. ?i?u ny c th? gip qu v? ??t v duy tr cn n?ng c l?i cho s?c kh?e, lm gi?m LDL cholesterol v t?ng HDL cholesterol. C th? h?u ch khi lm vi?c v?i m?t chuyn gia v? dinh d??ng v ch? ?? ?n (bc s? chuyn khoa dinh d??ng) ?? l?p k? ho?ch ph h?p cho qu v?. Bc s? chuyn khoa dinh d??ng ho?c chuyn gia ch?m [place] s?c kh?e c?a qu v? c th? khuy?n ngh?:  Gi?i h?n l??ng calo, n?u qu v? b? th?a cn.  ?n nhi?u tri cy, rau, ng? c?c nguyn cm, c v th?t n?c.  Gi?i h?n ch?t bo bo ha, ch?t bo trans v cholesterol.  Tun th? ch? d?n c?a chuyn gia ch?m [place] s?c kh?e ho?c bc s? chuyn khoa dinh d??ng v? cc h?n ch? ?n ho?c u?ng.  Gi?i h?n l??ng r??u qu v? u?ng khng qu m?t ly m?i ngy v?i ph? n? khng mang thai v hai ly m?i ngy v?i nam gi?i. M?t ly t??ng ???ng v?i 12 ao-x? bia, 5 ao-x? r??u vang, ho?c 1 ao-x? r??u m?nh.  Khng s? d?ng b?t k? s?n ph?m no ch?a nicotine ho?c thu?c l, ch?ng ha?n nh? thu?c l d?ng ht v thu?c l ?i?n t?. N?u qu v? c?n gip ?? ?? cai thu?c, hy h?i chuyn gia ch?m [place] s?c kh?e.  Tun th? t?t c? cc cu?c h?n khm l?i theo ch? d?n c?a chuyn gia ch?m [place] s?c kh?e. ?i?u ny c vai tr quan tr?ng. Hy lin l?c v?i chuyn gia ch?m [place] s?c kh?e n?u:  Qu v? g?p v?n ?? v?i vi?c  tun th? k? ho?ch t?p th? d?c ho?c ?n [place] c?a mnh.  Qu v? ??u tranh ?? b? ht thu?c ho?c ki?m sot vi?c s? d?ng r??u c?a mnh. Tm t?t  R?i lo?n m? mu l tnh tr?ng m?t cn b?ng cc ch?t gi?ng nh? sp, m? (ch?t bo) trong mu. C? th? chng ta c?n c l??ng nh? ch?t bo. R?i lo?n m? mu th??ng ko theo n?ng ?? cc lo?i ch?t bo cao l cholesterol ho?c triglycerides.  ?i?u tr? ph? thu?c vo lo?i r?i lo?n ch?t bo m qu v? b? v cc y?u t? nguy c? khc ??i v?i b?nh tim v ??t qu?.  ??i v?i nhi?u ng??i, ?i?u tr? b?t ??u b?ng vi?c thay ??i l?i s?ng, nh? l ch? ?? ?n v t?p th? d?c. Chuyn gia ch?m  s?c kh?e c?ng c th? k ??n thu?c ?? lm gi?m ch?t bo. Thng tin ny khng nh?m m?c ?ch  thay th? cho l?i khuyn m chuyn gia ch?m [place] s?c kh?e ni v?i qu v?. Hy b?o ??m qu v? ph?i th?o lu?n b?t k? v?n ?? g m qu v? c v?i chuyn gia ch?m [place] s?c kh?e c?a qu v?. Document Released: 09/25/2015 Document Revised: 04/29/2016 Document Reviewed: 12/05/2015 Else[employer] Interactive Patient Education  2017 [employer].

## 2016-11-12 NOTE — Progress Notes (Signed)
The 10-year ASCVD risk score Denman George DC Montez Hageman., et al., 2013) is: 17.2%   Values used to calculate the score:     Age: 66 years     Sex: Male     Is Non-Hispanic African American: No     Diabetic: No     Tobacco smoker: Yes     Systolic Blood Pressure: 110 mmHg     Is BP treated: No     HDL Cholesterol: 42 mg/dL     Total Cholesterol: 243 mg/dL Roshard Kissoon 66 y.o.   Chief Complaint  Patient presents with  . Lab Follow Up    per patient, he had labs drawn at last visit and would like the results  . Follow-up    per patient, has surgery 6 months ago in [place], was told he needs to follow up every 3 months   [facility] [id number]  HISTORY OF PRESENT ILLNESS: This is a 66 y.o. male here for 6 mo postsurgical F/U; had right partial nephrectomy in [place] for kidney CA; still smoking; no complaints; seen by me 5/14; normal BW except high cholesterol.  HPI   Prior to Admission medications   Medication Sig Start Date End Date Taking? Authorizing Provider  betamethasone valerate ointment (VALISONE) 0.1 % Apply 1 application topically 2 (two) times daily. 03/19/16   Sherren Mocha, MD  cetirizine (ZYRTEC) 10 MG tablet Take 1 tablet (10 mg total) by mouth at bedtime. 03/19/16   Sherren Mocha, MD  fluticasone (FLONASE) 50 MCG/ACT nasal spray Place 2 sprays into both nostrils at bedtime. 03/19/16   Sherren Mocha, MD  Tenofovir Alafenamide Fumarate (VEMLIDY) 25 MG TABS Take 1 tablet (25 mg total) by mouth daily. 11/08/15   Ginnie Smart, MD    No Known Allergies  Patient Active Problem List   Diagnosis Date Noted  . Erectile dysfunction 04/06/2015  . Tobacco use disorder 04/06/2015  . Hyperlipidemia 10/01/2014  . Hep B w/o coma 09/10/2014    Past Medical History:  Diagnosis Date  . Chronic kidney disease   . Hepatitis B   . Tobacco abuse     Past Surgical History:  Procedure Laterality Date  . KIDNEY SURGERY Right 06/07/2016   partial nephrectomy    Social History   Social History   . Marital status: Married    Spouse name: N/A  . Number of children: N/A  . Years of education: N/A   Occupational History  . Not on file.   Social History Main Topics  . Smoking status: Current Every Day Smoker    Packs/day: 0.50    Years: 20.00    Types: Cigarettes  . Smokeless tobacco: Never Used     Comment: would like to quit states "can not"  . Alcohol use No  . Drug use: No  . Sexual activity: Not on file   Other Topics Concern  . Not on file   Social History Narrative  . No narrative on file    Family History  Problem Relation Age of Onset  . Cancer Father        throat cancer     Review of Systems  Constitutional: Negative for chills, fever and weight loss.  HENT: Negative for congestion, nosebleeds and sore throat.   Eyes: Negative for blurred vision, double vision, discharge and redness.  Respiratory: Negative for cough, hemoptysis and shortness of breath.   Cardiovascular: Negative for chest pain, palpitations and leg swelling.  Gastrointestinal: Negative for abdominal pain,  diarrhea, nausea and vomiting.  Genitourinary: Negative for dysuria, flank pain and hematuria.  Musculoskeletal: Negative for myalgias and neck pain.  Skin: Negative for rash.  Neurological: Positive for weakness (general). Negative for dizziness and headaches.  Endo/Heme/Allergies: Negative.   All other systems reviewed and are negative.  Vitals:   11/12/16 0823 11/12/16 0838  BP: (!) 164/88 140/70  Pulse: 89   Resp: 16   Temp: 98.1 F (36.7 C)    Usually runs low BP.   Physical Exam  Constitutional: He is oriented to person, place, and time. He appears well-developed and well-nourished.  HENT:  Head: Normocephalic and atraumatic.  Eyes: Conjunctivae and EOM are normal. Pupils are equal, round, and reactive to light.  Neck: Normal range of motion. Neck supple. No JVD present. No thyromegaly present.  Cardiovascular: Normal rate, regular rhythm and normal heart sounds.    Pulmonary/Chest: Effort normal and breath sounds normal.  Abdominal: Soft. Bowel sounds are normal. He exhibits no distension. There is no tenderness.  Musculoskeletal: Normal range of motion. He exhibits no edema.  Lymphadenopathy:    He has no cervical adenopathy.  Neurological: He is alert and oriented to person, place, and time. No sensory deficit. He exhibits normal muscle tone.  Skin: Skin is warm and dry. Capillary refill takes less than 2 seconds. No rash noted.  Psychiatric: He has a normal mood and affect. His behavior is normal.  Vitals reviewed.  Recent Results (from the past 2160 hour(s))  CBC with Differential     Status: None   Collection Time: 10/28/16  9:10 AM  Result Value Ref Range   WBC 7.6 3.4 - 10.8 x10E3/uL   RBC 5.54 4.14 - 5.80 x10E6/uL   Hemoglobin 16.0 13.0 - 17.7 g/dL   Hematocrit 40.9 81.1 - 51.0 %   MCV 88 79 - 97 fL   MCH 28.9 26.6 - 33.0 pg   MCHC 32.9 31.5 - 35.7 g/dL   RDW 91.4 78.2 - 95.6 %   Platelets 220 150 - 379 x10E3/uL   Neutrophils 60 Not Estab. %   Lymphs 31 Not Estab. %   Monocytes 6 Not Estab. %   Eos 3 Not Estab. %   Basos 0 Not Estab. %   Neutrophils Absolute 4.5 1.4 - 7.0 x10E3/uL   Lymphocytes Absolute 2.3 0.7 - 3.1 x10E3/uL   Monocytes Absolute 0.4 0.1 - 0.9 x10E3/uL   EOS (ABSOLUTE) 0.3 0.0 - 0.4 x10E3/uL   Basophils Absolute 0.0 0.0 - 0.2 x10E3/uL   Immature Granulocytes 0 Not Estab. %   Immature Grans (Abs) 0.0 0.0 - 0.1 x10E3/uL  Comprehensive metabolic panel     Status: Abnormal   Collection Time: 10/28/16  9:10 AM  Result Value Ref Range   Glucose 101 (H) 65 - 99 mg/dL   BUN 15 8 - 27 mg/dL   Creatinine, Ser 2.13 0.76 - 1.27 mg/dL   GFR calc non Af Amer 95 >59 mL/min/1.73   GFR calc Af Amer 110 >59 mL/min/1.73   BUN/Creatinine Ratio 19 10 - 24   Sodium 143 134 - 144 mmol/L   Potassium 4.1 3.5 - 5.2 mmol/L   Chloride 101 96 - 106 mmol/L   CO2 21 18 - 29 mmol/L   Calcium 9.4 8.6 - 10.2 mg/dL   Total Protein 7.8  6.0 - 8.5 g/dL   Albumin 4.7 3.6 - 4.8 g/dL   Globulin, Total 3.1 1.5 - 4.5 g/dL   Albumin/Globulin Ratio 1.5 1.2 - 2.2  Bilirubin Total 0.4 0.0 - 1.2 mg/dL   Alkaline Phosphatase 89 39 - 117 IU/L   AST 31 0 - 40 IU/L   ALT 43 0 - 44 IU/L  Hemoglobin A1c     Status: None   Collection Time: 10/28/16  9:10 AM  Result Value Ref Range   Hgb A1c MFr Bld 5.6 4.8 - 5.6 %    Comment:          Pre-diabetes: 5.7 - 6.4          Diabetes: >6.4          Glycemic control for adults with diabetes: <7.0    Est. average glucose Bld gHb Est-mCnc 114 mg/dL  Lipid panel     Status: Abnormal   Collection Time: 10/28/16  9:10 AM  Result Value Ref Range   Cholesterol, Total 243 (H) 100 - 199 mg/dL   Triglycerides 454 (H) 0 - 149 mg/dL   HDL 42 >09 mg/dL   VLDL Cholesterol Cal 39 5 - 40 mg/dL   LDL Calculated 811 (H) 0 - 99 mg/dL   Chol/HDL Ratio 5.8 (H) 0.0 - 5.0 ratio    Comment:                                   T. Chol/HDL Ratio                                             Men  Women                               1/2 Avg.Risk  3.4    3.3                                   Avg.Risk  5.0    4.4                                2X Avg.Risk  9.6    7.1                                3X Avg.Risk 23.4   11.0   PSA(Must document that pt has been informed of limitations of PSA testing.)     Status: None   Collection Time: 10/28/16  9:10 AM  Result Value Ref Range   Prostate Specific Ag, Serum 1.9 0.0 - 4.0 ng/mL    Comment: Roche ECLIA methodology. According to the American Urological Association, Serum PSA should decrease and remain at undetectable levels after radical prostatectomy. The AUA defines biochemical recurrence as an initial PSA value 0.2 ng/mL or greater followed by a subsequent confirmatory PSA value 0.2 ng/mL or greater. Values obtained with different assay methods or kits cannot be used interchangeably. Results cannot be interpreted as absolute evidence of the presence or absence of  malignant disease.   TSH     Status: None   Collection Time: 10/28/16  9:10 AM  Result Value Ref Range   TSH 1.530 0.450 - 4.500 uIU/mL  HIV antibody     Status:  None   Collection Time: 10/28/16  9:10 AM  Result Value Ref Range   HIV Screen 4th Generation wRfx Non Reactive Non Reactive     ASSESSMENT & PLAN: Taggart was seen today for lab follow up and follow-up.  Diagnoses and all orders for this visit:  History of kidney cancer -     [place], retroperitnl abd,  ltd  H/O partial nephrectomy  Dyslipidemia -     pravastatin (PRAVACHOL) 40 MG tablet; Take 1 tablet (40 mg total) by mouth daily.  Routine general medical examination at a health care facility  Smoker  History of gallstones -     US Abdomen Limited RUQ; Future    Patient Instructions       IF you received an x-ray today, you will receive an invoice from [facility]. Please contact [facility] at [phone] with questions or concerns regarding your invoice.   IF you received labwork today, you will receive an invoice from [facility]. Please contact [facility] at [phone] with questions or concerns regarding your invoice.   Our billing staff will not be able to assist you with questions regarding bills from these companies.  You will be contacted with the lab results as soon as they are available. The fastest way to get your results is to activate your My Chart account. Instructions are located on the last page of this paperwork. If you have not heard from [place] regarding the results in 2 weeks, please contact this office.     R?i loa?n m?? ma?u Dyslipidemia R?i lo?n m? mu l tnh tr?ng m?t cn b?ng cc ch?t gi?ng nh? sp, m? (ch?t bo) trong mu. C? th? chng ta c?n c l??ng nh? ch?t bo. R?i lo?n m? mu th??ng ko theo n?ng ?? cc lo?i ch?t bo cao l cholesterol ho?c triglycerides. Cc d?ng r?i loa?n m?? ma?u th??ng g?p bao g?m:  N?ng ?? cholesterol x?u (LDL cholesterol) cao. LDL l lo?i  cholesterol gy ra cc c?n ch?t bo (m?ng bm) tch t? trong m?ch mu mang mu ra kh?i tim qu v? (??ng m?ch).  N?ng ?? cholesterol t?t (HDL cholesterol) th?p. HDL cholesterol l lo?i cholesterol b?o v? ch?ng l?i b?nh tim. N?ng ?? HDL cao s? lo?i b? m?ng bm LDL ra kh?i ??ng m?ch.  N?ng ?? triglycerides cao. Triglycerides la? ch?t be?o trong ma?u lin quan ??n s? ti?ch tu? ma?ng ba?m trong ??ng ma?ch. Qu v? c th? pht tri?n b?nh r?i lo?n m? mu do mang cc gen di truy?n (r?i lo?n m? mu nguyn pht) ho?c do cc thay ??i x?y ra trong cu?c s?ng (r?i lo?n m? mu th? pht) ho?c ?y l tc d?ng ph? c?a m?t s? ph??ng php ?i?u tr? y khoa nh?t ??nh. Nguyn nhn g gy ra? R?i lo?n m? mu nguyn pht l do cc thay ??i (??t bi?n) trong gen ???c truy?n qua cc th? h? trong gia ?nh (di truy?n). Nh?ng ??t bi?n ny gy ra nhi?u lo?i r?i lo?n m? mu. ??t bi?n c th? ??a ??n cc r?i lo?n khi?n c? th? s?n xu?t qu nhi?u LDL cholesterol ho?c triglycerides ho?c s?n xu?t khng ?? HDL cholesterol. Nh?ng r?i lo?n ny c th? d?n ??n b?nh tim, b?nh ??ng m?ch ho?c ??t qu? khi tu?i cn tr?. Cc nguyn nhn gy r?i lo?n m? mu th? pht bao g?m m?t s? l?a ch?n v? l?i s?ng v b?nh l nh?t ??nh d?n ??n r?i lo?n m? mu, nh? l:  ?n ch? ?? ?n nhi?u m? ??ng v?t.  Khng ho?t ??ng ho?c khng t?p th? d?c ?? (c l?i s?ng t v?n ??ng).  B? b?nh ti?u ???ng, b?nh th?n, b?nh gan ho?c b?nh tuy?n gip.  U?ng nhi?u r??u.  S? d?ng m?t s? lo?i thu?c nh?t ??nh. ?i?u g lm t?ng nguy c?? Qu v? c th? c nguy c? b? r?i lo?n m? mu cao h?n n?u qu v? l nam gi?i l?n tu?i ho?c n?u qu v? l n? gi?i ? tr?i qua th?i k? mn kinh. Cc y?u t? nguy c? khc bao g?m:  C ti?n s? gia ?nh b? r?i lo?n m? mu.  ?ang dng m?t s? loa?i thu?c no ?, bao g?m vin thu?c tra?nh thai, steroid, m?t s? thu?c l?i ti?u, thu?c che?n beta va? m?t s? loa?i thu?c ?i?u tr? HIV.  Ht thu?c l.  ?n ch? ?? ?n nhi?u m??.  U?ng nhi?u  r??u.  C m?t s? ti?nh tra?ng b?nh ly? nh? ti?u ????ng, h?i ch??ng bu?ng tr??ng ?a nang (PCOS), mang thai, b?nh th?n, b?nh gan ho?c suy gia?p.  Khng t?p th? d?c th??ng xuyn.  B? th?a cn ho?c bo ph v?i m? b?ng qu nhi?u. Cc d?u hi?u v tri?u ch?ng l g? R?i lo?n m? mu th??ng khng gy ra b?t k? tri?u ch?ng no. N?ng ?? ch?t bo r?t cao c th? gy ra cc b??u m? d??i da (u vng) ho?c m?t vng trn mu tr?ng ho?c xm xung quanh tm mu ?en (??ng t?) c?a m?t. N?ng ?? triglyceride r?t cao c th? gy vim tuy?n t?y (vim t?y). Ch?n ?on tnh tr?ng ny nh? th? no? Chuyn gia ch?m [place] s?c kh?e c th? ch?n ?on r?i lo?n m? mu d?a trn m?t xt nghi?m mu thng th??ng (xt nghi?m mu lc ?i). Do h?u h?t m?i ng??i khng c tri?u ch?ng c?a tnh tr?ng ny, xt nghi?m mu ny (h? s? ch?t bo) ???c th?c hi?n ? ng??i tr??ng thnh t? 20 tu?i tr? ln v ???c l?p l?i 5 n?m m?t l?n. Xt nghi?m ny ki?m tra:  Cholesterol ton ph?n. ?y l m?t th??c ?o t?ng l??ng cholesterol trong mu c?a qu v?, bao g?m LDL cholesterol, HDL cholesterol v triglycerides. Con s? c?a ng??i kh?e m?nh l d??i 200.  LDL cholesterol. S? mu?c tiu cu?a LDL cholesterol ?? m?i ng???i khc nhau, tu?y thu?c va?o ca?c y?u t? nguy c? c nhn. ??i v?i h?u h?t m?i ng??i, s? d??i 100 l kh?e m?nh. Hy h?i chuyn gia ch?m [place] s?c kh?e c?a quy? vi? xem s? LDL cholesterol c?a quy? vi? nn la? bao nhiu.  HDL cholesterol. M?c HDL t? 60 tr? ln l t?t nh?t v n gip b?o v? ch?ng l?i b?nh tim. S? d??i 40 ??i v?i nam gi?i ho?c d??i 50 ??i v?i n? gi?i lm t?ng nguy c? b? b?nh tim.  Triglycerides. S? triglycecide ? ng??i kh?e m?nh l d???i 150. N?u h? s? ch?t bo c?a qu v? b?t th??ng, chuyn gia ch?m [place] s?c kh?e c th? th?c hi?n cc xt nghi?m mu khc ?? c thm thng tin v? tnh tr?ng c?a qu v?. Tnh tr?ng ny ???c ?i?u tr? nh? th? no? ?i?u tr? ph? thu?c vo lo?i r?i lo?n ch?t bo m qu v? b? v cc y?u t? nguy c? khc ??i v?i  b?nh tim v ??t qu?Marland Kitchen Chuyn gia ch?m [place] s?c kh?e s? c m?t kho?ng m?c tiu cho m?c ch?t bo c?a qu v? d?a trn thng tin ny. ??i v?i nhi?u ng??i, ?i?u tr? b?t ??u b?ng vi?c thay ??i  l?i s?ng, nh? l ch? ?? ?n v t?p th? d?c. Chuyn gia ch?m [place] s?c kh?e c th? khuy?n ngh? r?ng qu v?:  T?p th? d?c th??ng xuyn.  Thay ??i ch? ?? ?n.  B? thu?c l, n?u qu v? ht thu?c. N?u thay ??i ch? ?? ?n v t?p th? d?c khng gip qu v? ??t ???c m?c tiu c?a mnh, chuyn gia ch?m [place] s?c kh?e c?ng c th? k ??n thu?c ?? lm gi?m ch?t bo. Lo?i thu?c ???c k ??n th??ng xuyn nh?t s? lm gi?m LDL cholesterol (thu?c statin). N?u qu v? c n?ng ?? triglyceride cao, chuyn gia c th? k ??n m?t lo?i thu?c khc (fibrate) ho?c th?c ph?m b? sung d?u c omega-3 ho?c c? hai. Tun th? nh?ng h??ng d?n ny ? nh:  Ch? s? d?ng thu?c khng k ??n v thu?c k ??n theo ch? d?n c?a chuyn gia ch?m [place] s?c kh?e. ?i?u ny bao g?m cc th?c ph?m b? sung.  T?p th? d?c th??ng xuyn. B??t ??u m?t ch??ng tri?nh t?p th?m m? v luy?n t?p t?ng s?c m?nh c? b?p theo chi? d?n cu?a chuyn gia ch?m so?c s??c kho?e. H?i chuyn gia ch?m [place] s?c kh?e v? cc ho?t ??ng no an ton cho qu v?. Chuyn gia ch?m [place] s?c kh?e c?a qu v? c th? khuy?n ngh?:  Ho?t ??ng th? d?c th?m m? 30 pht m?i ngy, 4-6 ngy m?i tu?n. ?i b? nhanh l m?t v d? v? ho?t ??ng th? d?c th?m m?.  Luy?n t?p t?ng s?c m?nh c? b?p 2 ngy m?i tu?n.  ?n ch? ?? ?n lnh m?nh theo ch? d?n c?a chuyn gia ch?m [place] s?c kh?e c?a qu v?. ?i?u ny c th? gip qu v? ??t v duy tr cn n?ng c l?i cho s?c kh?e, lm gi?m LDL cholesterol v t?ng HDL cholesterol. C th? h?u ch khi lm vi?c v?i m?t chuyn gia v? dinh d??ng v ch? ?? ?n (bc s? chuyn khoa dinh d??ng) ?? l?p k? ho?ch ph h?p cho qu v?. Bc s? chuyn khoa dinh d??ng ho?c chuyn gia ch?m [place] s?c kh?e c?a qu v? c th? khuy?n ngh?:  Gi?i h?n l??ng calo, n?u qu v? b? th?a cn.  ?n nhi?u tri cy, rau, ng? c?c nguyn cm,  c v th?t n?c.  Gi?i h?n ch?t bo bo ha, ch?t bo trans v cholesterol.  Tun th? ch? d?n c?a chuyn gia ch?m [place] s?c kh?e ho?c bc s? chuyn khoa dinh d??ng v? cc h?n ch? ?n ho?c u?ng.  Gi?i h?n l??ng r??u qu v? u?ng khng qu m?t ly m?i ngy v?i ph? n? khng mang thai v hai ly m?i ngy v?i nam gi?i. M?t ly t??ng ???ng v?i 12 ao-x? bia, 5 ao-x? r??u vang, ho?c 1 ao-x? r??u m?nh.  Khng s? d?ng b?t k? s?n ph?m no ch?a nicotine ho?c thu?c l, ch?ng ha?n nh? thu?c l d?ng ht v thu?c l ?i?n t?. N?u qu v? c?n gip ?? ?? cai thu?c, hy h?i chuyn gia ch?m [place] s?c kh?e.  Tun th? t?t c? cc cu?c h?n khm l?i theo ch? d?n c?a chuyn gia ch?m [place] s?c kh?e. ?i?u ny c vai tr quan tr?ng. Hy lin l?c v?i chuyn gia ch?m [place] s?c kh?e n?u:  Qu v? g?p v?n ?? v?i vi?c tun th? k? ho?ch t?p th? d?c ho?c ?n [place] c?a mnh.  Qu v? ??u tranh ?? b? ht thu?c ho?c ki?m sot vi?c s? d?ng r??u c?a mnh. Tm t?t  R?i  lo?n m? mu l tnh tr?ng m?t cn b?ng cc ch?t gi?ng nh? sp, m? (ch?t bo) trong mu. C? th? chng ta c?n c l??ng nh? ch?t bo. R?i lo?n m? mu th??ng ko theo n?ng ?? cc lo?i ch?t bo cao l cholesterol ho?c triglycerides.  ?i?u tr? ph? thu?c vo lo?i r?i lo?n ch?t bo m qu v? b? v cc y?u t? nguy c? khc ??i v?i b?nh tim v ??t qu?.  ??i v?i nhi?u ng??i, ?i?u tr? b?t ??u b?ng vi?c thay ??i l?i s?ng, nh? l ch? ?? ?n v t?p th? d?c. Chuyn gia ch?m [place] s?c kh?e c?ng c th? k ??n thu?c ?? lm gi?m ch?t bo. Thng tin ny khng nh?m m?c ?ch thay th? cho l?i khuyn m chuyn gia ch?m [place] s?c kh?e ni v?i qu v?. Hy b?o ??m qu v? ph?i th?o lu?n b?t k? v?n ?? g m qu v? c v?i chuyn gia ch?m [place] s?c kh?e c?a qu v?. Document Released: 09/25/2015 Document Revised: 04/29/2016 Document Reviewed: 12/05/2015 Elsevier Interactive Patient Education  2017 Elsevier Inc.      Edwina BarthMiguel Elainna Eshleman, MD [facility]

## 2016-12-03 ENCOUNTER — Ambulatory Visit (INDEPENDENT_AMBULATORY_CARE_PROVIDER_SITE_OTHER): Payer: BLUE CROSS/BLUE SHIELD | Admitting: Family Medicine

## 2016-12-03 VITALS — BP 144/78 | HR 85 | Temp 98.3°F | Resp 16 | Ht 66.0 in | Wt 140.6 lb

## 2016-12-03 DIAGNOSIS — L719 Rosacea, unspecified: Secondary | ICD-10-CM

## 2016-12-03 DIAGNOSIS — B354 Tinea corporis: Secondary | ICD-10-CM | POA: Diagnosis not present

## 2016-12-03 MED ORDER — METRONIDAZOLE 0.75 % EX GEL: CUTANEOUS | 5 refills | Status: DC

## 2016-12-03 NOTE — Patient Instructions (Addendum)
When you may, I recommend that you use DOVE UNSCENTED soap.  You can use SELSUN BLUE shampoo or HEAD AND SHOULDERS shampoo for your hair.  At bedtime each night apply a small amount of the METRONIDAZOLE GEL (PRESCRIPTION)  to the affected areas.  The place on your leg appears different. It is probably a skin fungus. You can buy at the pharmacy without a prescription some CLOTRIMAZOLE CREAM to use on that twice daily.  It may take several weeks before the skin is doing better, so be patient. If you do not improve over the next month or 6 weeks return for a recheck.   Rosacea Rosacea is a long-term (chronic) condition that affects the skin of the face, including the cheeks, nose, brow, and chin. This condition can also affect the eyes. Rosacea causes blood vessels near the surface of the skin to get bigger (be enlarged), and that makes the skin red. There is no cure for this condition, but treatment can help to control your symptoms. Follow these instructions at home: Skin Care Take care of your skin as told by your doctor. Your doctor may tell you do these things:  Wash your skin gently two or more times each day.  Use mild soap.  Use a sunscreen or sunblock with SPF 30 or greater.  Use gentle cosmetics that are meant for sensitive skin.  Shave with an electric shaver instead of a blade.  Lifestyle  Try to keep track of what foods trigger this condition. Avoid any triggers. These may include: ? Spicy foods. ? Seafood. ? Cheese. ? Hot liquids. ? Nuts. ? Chocolate. ? Iodized salt.  Do not drink alcohol.  Avoid extremely cold or hot temperatures.  Try to reduce your stress. If you need help to do this, talk with your doctor.  When you exercise, do these things to stay cool: ? Limit your sun exposure. ? Use a fan. ? Exercise for a shorter time, and exercise more often. General instructions  Keep all follow-up visits as told by your doctor. This is important.  Take  over-the-counter and prescription medicines only as told by your doctor.  If your eyelids are affected, press warm compresses to them. Do this as told by your doctor.  If you were prescribed an antibiotic medicine, apply or take it as told by your doctor. Do not stop using the antibiotic even if your condition improves. Contact a doctor if:  Your symptoms get worse.  Your symptoms do not improve after two months of treatment.  You have new symptoms.  You have any changes in vision or you have problems with your eyes, such as redness or itching.  You feel very sad (depressed).  You lose your appetite.  You have trouble concentrating. This information is not intended to replace advice given to you by your health care provider. Make sure you discuss any questions you have with your health care provider. Document Released: 08/26/2011 Document Revised: 11/09/2015 Document Reviewed: 08/10/2014 Elsevier Interactive Patient Education  2018 [employer]      IF you received an x-ray today, you will receive an invoice from [facility]. Please contact [facility] at [phone] with questions or concerns regarding your invoice.   IF you received labwork today, you will receive an invoice from [facility]. Please contact [facility] at [phone] with questions or concerns regarding your invoice.   Our billing staff will not be able to assist you with questions regarding bills from these companies.  You will be  contacted with the lab results as soon as they are available. The fastest way to get your results is to activate your My Chart account. Instructions are located on the last page of this paperwork. If you have not heard from [place] regarding the results in 2 weeks, please contact this office.

## 2016-12-03 NOTE — Progress Notes (Signed)
Patient ID: Selah Zelman, male    DOB: 05-08-1951  Age: 66 y.o. MRN: [id number]  Chief Complaint  Patient presents with  . Rash    Pt has a flaky rash on scalp and face     Subjective:   66 year old [place] American gentleman who does not speak [place]. We used the computer interpreter.  For about 10 years she has had problems with a rash on his face and his forehead, in the ears and upper back and upper chest. He got some treatment for it in [place] 10 years ago that did not help. He has been in the [place] for 5 years and not seen a doctor for it. It itches and burns. He is now retired, and is not excessively in the sun.  He also has another rash on his right calf which he wondered about.  Current allergies, medications, problem list, past/family and social histories reviewed.  Objective:  BP (!) 144/78   Pulse 85   Temp 98.3 F (36.8 C) (Oral)   Resp 16   Ht 5\' 6"  (1.676 m)   Wt 140 lb 9.6 oz (63.8 kg)   SpO2 97%   BMI 22.69 kg/m   Dry flaking skin on the forehead, around the nose, in the opening of the ears, and possibly mildly under the chin and on upper back and upper chest.  He has a chronic fungal appearing rash on his right lateral calf.  Assessment & Plan:   Assessment: 1. Rosacea   2. Tinea corporis       Plan: This appears most consistent with rosacea. Will treat him accordingly. The rash on the leg is probably fungal and recommended OTC treatment for that.  No orders of the defined types were placed in this encounter.   Meds ordered this encounter  Medications  . metroNIDAZOLE (METROGEL) 0.75 % gel    Sig: Apply a small amount of the gel to the affected areas before bedtime once daily    Dispense:  45 g    Refill:  5         Patient Instructions   When you may, I recommend that you use DOVE UNSCENTED soap.  You can use SELSUN BLUE shampoo or HEAD AND SHOULDERS shampoo for your hair.  At bedtime each night apply a small amount of the  METRONIDAZOLE GEL (PRESCRIPTION)  to the affected areas.  The place on your leg appears different. It is probably a skin fungus. You can buy at the pharmacy without a prescription some CLOTRIMAZOLE CREAM to use on that twice daily.  It may take several weeks before the skin is doing better, so be patient. If you do not improve over the next month or 6 weeks return for a recheck.   Rosacea Rosacea is a long-term (chronic) condition that affects the skin of the face, including the cheeks, nose, brow, and chin. This condition can also affect the eyes. Rosacea causes blood vessels near the surface of the skin to get bigger (be enlarged), and that makes the skin red. There is no cure for this condition, but treatment can help to control your symptoms. Follow these instructions at home: Skin Care Take care of your skin as told by your doctor. Your doctor may tell you do these things:  Wash your skin gently two or more times each day.  Use mild soap.  Use a sunscreen or sunblock with SPF 30 or greater.  Use gentle cosmetics that are meant for sensitive skin.  Shave with an electric shaver instead of a blade.  Lifestyle  Try to keep track of what foods trigger this condition. Avoid any triggers. These may include: ? Spicy foods. ? Seafood. ? Cheese. ? Hot liquids. ? Nuts. ? Chocolate. ? Iodized salt.  Do not drink alcohol.  Avoid extremely cold or hot temperatures.  Try to reduce your stress. If you need help to do this, talk with your doctor.  When you exercise, do these things to stay cool: ? Limit your sun exposure. ? Use a fan. ? Exercise for a shorter time, and exercise more often. General instructions  Keep all follow-up visits as told by your doctor. This is important.  Take over-the-counter and prescription medicines only as told by your doctor.  If your eyelids are affected, press warm compresses to them. Do this as told by your doctor.  If you were prescribed an  antibiotic medicine, apply or take it as told by your doctor. Do not stop using the antibiotic even if your condition improves. Contact a doctor if:  Your symptoms get worse.  Your symptoms do not improve after two months of treatment.  You have new symptoms.  You have any changes in vision or you have problems with your eyes, such as redness or itching.  You feel very sad (depressed).  You lose your appetite.  You have trouble concentrating. This information is not intended to replace advice given to you by your health care provider. Make sure you discuss any questions you have with your health care provider. Document Released: 08/26/2011 Document Revised: 11/09/2015 Document Reviewed: 08/10/2014 Elsevier Interactive Patient Education  2018 [employer]      IF you received an x-ray today, you will receive an invoice [facility]. Please contact [facility] at [phone] with questions or concerns regarding your invoice.   IF you received labwork today, you will receive an invoice from [facility]. Please contact [facility] at [phone] with questions or concerns regarding your invoice.   Our billing staff will not be able to assist you with questions regarding bills from these companies.  You will be contacted with the lab results as soon as they are available. The fastest way to get your results is to activate your My Chart account. Instructions are located on the last page of this paperwork. If you have not heard from us regarding the results in 2 weeks, please contact this office.         Return if symptoms worsen or fail to improve.   Barrie Wale, MD 12/03/2016

## 2017-02-14 ENCOUNTER — Ambulatory Visit (INDEPENDENT_AMBULATORY_CARE_PROVIDER_SITE_OTHER): Payer: BLUE CROSS/BLUE SHIELD | Admitting: Emergency Medicine

## 2017-02-14 ENCOUNTER — Telehealth: Payer: Self-pay | Admitting: *Deleted

## 2017-02-14 ENCOUNTER — Encounter: Payer: Self-pay | Admitting: Emergency Medicine

## 2017-02-14 VITALS — BP 125/70 | HR 65 | Temp 98.6°F | Resp 16 | Ht 65.75 in | Wt 142.8 lb

## 2017-02-14 DIAGNOSIS — Z905 Acquired absence of kidney: Secondary | ICD-10-CM

## 2017-02-14 DIAGNOSIS — Z8719 Personal history of other diseases of the digestive system: Secondary | ICD-10-CM | POA: Diagnosis not present

## 2017-02-14 DIAGNOSIS — E785 Hyperlipidemia, unspecified: Secondary | ICD-10-CM | POA: Diagnosis not present

## 2017-02-14 DIAGNOSIS — Z85528 Personal history of other malignant neoplasm of kidney: Secondary | ICD-10-CM | POA: Diagnosis not present

## 2017-02-14 DIAGNOSIS — H6693 Otitis media, unspecified, bilateral: Secondary | ICD-10-CM | POA: Insufficient documentation

## 2017-02-14 DIAGNOSIS — H65193 Other acute nonsuppurative otitis media, bilateral: Secondary | ICD-10-CM

## 2017-02-14 MED ORDER — AMOXICILLIN-POT CLAVULANATE 875-125 MG PO TABS: 1.0000 | ORAL_TABLET | Freq: Two times a day (BID) | ORAL | 0 refills | Status: AC

## 2017-02-14 NOTE — Patient Instructions (Addendum)
   IF you received an x-ray today, you will receive an invoice from [facility]. Please contact [facility] at [phone] with questions or concerns regarding your invoice.   IF you received labwork today, you will receive an invoice from [facility]. Please contact [facility] at [phone] with questions or concerns regarding your invoice.   Our billing staff will not be able to assist you with questions regarding bills from these companies.  You will be contacted with the lab results as soon as they are available. The fastest way to get your results is to activate your My Chart account. Instructions are located on the last page of this paperwork. If you have not heard from us regarding the results in 2 weeks, please contact this office.     Vim tai gi?a, Ng??i l?n (Otitis Media, Adult) Vim tai gi?a l hi?n t??ng t?y ??, ?au nh?c v vim ? tai gi?a. Vim tai gi?a c th? do d? ?ng, ho?c ph? bi?n nh?t l do nhi?m trng gy ra. B?nh th??ng x?y ra d??i d?ng bi?n ch?ng c?a c?m l?nh thng th??ng. D?U HI?U V TRI?U CH?NG Tri?u ch?ng c?a vim tai gi?a c th? bao g?m:  ?au tai.  S?t.  Ti?ng  trong tai.  ?au ??u.  R r? d?ch t? tai. CH?N ?ON ?? ch?n ?on vim tai gi?a, chuyn gia ch?m sc s?c kh?e s? ki?m tra tai qu v? b?ng m?t ?ng soi tai. ?y l m?t d?ng c? cho php chuyn gia ch?m sc s?c kh?e nhn vo trong tai c?a qu v? ?? ki?m tra mng nh?. Chuyn gia ch?m sc s?c kh?e c?ng s? h?i qu v? v? cc tri?u ch?ng c?a qu v?. ?I?U TR? Vim tai gi?a th??ng t? kh?i trong vng 3 - 5 ngy. Chuyn gia ch?m sc s?c kh?e c?a qu v? c th? k ??n thu?c ?? gi?m cc tri?u ch?ng ?au. N?u vim tai gi?a khng kh?i trong vng 5 ngy ho?c ti pht, chuyn gia ch?m sc s?c kh?e c th? s? k ??n thu?c khng sinh n?u h? nghi ng? nguyn nhn l do nhi?m vi khu?n. H??NG D?N CH?M SC T?I NH  N?u qu v? ???c k ??n dng thu?c khng sinh, hy dng h?t thu?c ngay c? khi qu v? b?t ??u c?m  th?y ?? h?n.  Ch? s? d?ng thu?c theo ch? d?n c?a chuyn gia ch?m sc s?c kh?e.  Tun th? t?t c? cc cu?c h?n khm l?i theo ch? d?n c?a chuyn gia ch?m sc s?c kh?e.  ?I KHM N?U:  Qu v? ch? b? vim tai gi?a ? m?t tai, ho?c ch?y mu m?i, ho?c c? hai.  Qu v? th?y m?t kh?i u trn c? c?a qu v?.  Qu v? khng ?? h?n trong 3-5 ngy.  Qu v? c?m th?y t? h?n thay v t?t h?n.  NGAY L?P T?C ?I KHM N?U:  Qu v? b? ?au khng ki?m sot ???c b?ng thu?c.  Qu v? b? s?ng, ??, ho?c ?au xung quanh tai ho?c c?ng ? c?.  Qu v? th?y m?t ph?n khun m?t c?a qu v? b? t li?t.  Qu v? th?y x??ng pha sau tai c?a qu v? (x??ng ch?m) nh?y c?m ?au khi ch?m vo.  ??M B?O QU V?:  Hi?u r cc h??ng d?n ny.  S? theo di tnh tr?ng c?a mnh.  S? yu c?u tr? gip ngay l?p t?c n?u qu v? c?m th?y khng kh?e ho?c th?y tr?m tr?ng h?n.  Thng tin ny khng nh?m m?c ?ch   thay th? cho l?i khuyn m chuyn gia ch?m sc s?c kh?e ni v?i qu v?. Hy b?o ??m qu v? ph?i th?o lu?n b?t k? v?n ?? g m qu v? c v?i chuyn gia ch?m sc s?c kh?e c?a qu v?. Document Released: 06/30/2015 Document Revised: 06/30/2015 Document Reviewed: 12/29/2012 Elsevier Interactive Patient Education  2017 Elsevier Inc.  

## 2017-02-14 NOTE — Telephone Encounter (Signed)
Called [facility] pharmacy on [address] to let pharmacist know patient needs to called, because he has a Rx to be picked up. The medication is amoxicillin-clavulanate, and he speaks [place] language.

## 2017-02-14 NOTE — Progress Notes (Signed)
Tyler Carter 66 y.o.   Chief Complaint  Patient presents with  . Follow-up    medication for cholesterol    HISTORY OF PRESENT ILLNESS: This is a 66 y.o. male complaining of bilateral ear pain and fullness x several days; also here for lipid follow up and follow up of kidney cancer s/p nephrectomy; doing well. Denies hematuria, fever, n/v, abdominal pain, or any other significant symptoms. Hx obtained thru interpreter on the phone.  Ear Fullness   There is pain in both ears. This is a new problem. The current episode started in the past 7 days. The problem occurs constantly. The problem has been gradually worsening. There has been no fever. The pain is at a severity of 5/10. The pain is moderate. Associated symptoms include hearing loss. Pertinent negatives include no abdominal pain, coughing, diarrhea, ear discharge, headaches, neck pain, rash, sore throat or vomiting. He has tried nothing for the symptoms.     Prior to Admission medications   Medication Sig Start Date End Date Taking? Authorizing Provider  betamethasone valerate ointment (VALISONE) 0.1 % Apply 1 application topically 2 (two) times daily. 03/19/16  Yes Shawnee Knapp, MD  cetirizine (ZYRTEC) 10 MG tablet Take 1 tablet (10 mg total) by mouth at bedtime. 03/19/16  Yes Shawnee Knapp, MD  fluticasone (FLONASE) 50 MCG/ACT nasal spray Place 2 sprays into both nostrils at bedtime. 03/19/16  Yes Shawnee Knapp, MD  metroNIDAZOLE (METROGEL) 0.75 % gel Apply a small amount of the gel to the affected areas before bedtime once daily 12/03/16  Yes Posey Boyer, MD  pravastatin (PRAVACHOL) 40 MG tablet Take 1 tablet (40 mg total) by mouth daily. 11/12/16  Yes Gavinn Collard, Ines Bloomer, MD  Tenofovir Alafenamide Fumarate (VEMLIDY) 25 MG TABS Take 1 tablet (25 mg total) by mouth daily. 11/08/15  Yes Campbell Riches, MD  amoxicillin-clavulanate (AUGMENTIN) 875-125 MG tablet Take 1 tablet by mouth 2 (two) times daily. 02/14/17 02/21/17  Horald Pollen,  MD    No Known Allergies  Patient Active Problem List   Diagnosis Date Noted  . Bilateral otitis media 02/14/2017  . H/O partial nephrectomy 11/12/2016  . History of kidney cancer 11/12/2016  . Dyslipidemia 11/12/2016  . Routine general medical examination at a health care facility 11/12/2016  . History of gallstones 11/12/2016  . Erectile dysfunction 04/06/2015  . Tobacco use disorder 04/06/2015  . Hyperlipidemia 10/01/2014  . Hep B w/o coma 09/10/2014    Past Medical History:  Diagnosis Date  . Chronic kidney disease   . Hepatitis B   . Tobacco abuse     Past Surgical History:  Procedure Laterality Date  . KIDNEY SURGERY Right 06/07/2016   partial nephrectomy    Social History   Social History  . Marital status: Married    Spouse name: N/A  . Number of children: N/A  . Years of education: N/A   Occupational History  . Not on file.   Social History Main Topics  . Smoking status: Current Every Day Smoker    Packs/day: 0.50    Years: 20.00    Types: Cigarettes  . Smokeless tobacco: Never Used     Comment: would like to quit states "can not"  . Alcohol use No  . Drug use: No  . Sexual activity: Not on file   Other Topics Concern  . Not on file   Social History Narrative  . No narrative on file    Family History  Problem  Relation Age of Onset  . Cancer Father        throat cancer     Review of Systems  Constitutional: Negative.  Negative for chills, fever, malaise/fatigue and weight loss.  HENT: Positive for ear pain (fullness) and hearing loss. Negative for ear discharge and sore throat.   Eyes: Negative.  Negative for blurred vision and double vision.  Respiratory: Negative.  Negative for cough, hemoptysis and shortness of breath.   Cardiovascular: Negative.  Negative for chest pain, palpitations and leg swelling.  Gastrointestinal: Negative.  Negative for abdominal pain, diarrhea, nausea and vomiting.  Genitourinary: Negative.  Negative for  dysuria and hematuria.  Musculoskeletal: Negative.  Negative for myalgias and neck pain.  Skin: Negative.  Negative for rash.  Neurological: Negative.  Negative for dizziness and headaches.  Endo/Heme/Allergies: Negative.   All other systems reviewed and are negative.   Vitals:   02/14/17 0913  BP: 125/70  Pulse: 65  Resp: 16  Temp: 98.6 F (37 C)  SpO2: 98%    Physical Exam  Constitutional: He is oriented to person, place, and time. He appears well-developed and well-nourished.  HENT:  Head: Normocephalic and atraumatic.  Right Ear: External ear normal. Tympanic membrane is injected.  Left Ear: External ear normal. Tympanic membrane is injected.  Nose: Nose normal.  Mouth/Throat: Oropharynx is clear and moist.  Eyes: Pupils are equal, round, and reactive to light. Conjunctivae and EOM are normal.  Neck: Normal range of motion. Neck supple. No JVD present. No thyromegaly present.  Cardiovascular: Normal rate, regular rhythm and normal heart sounds.   Pulmonary/Chest: Effort normal and breath sounds normal.  Abdominal: Soft. Bowel sounds are normal. He exhibits no distension and no mass. There is no tenderness.  Musculoskeletal: Normal range of motion.  Lymphadenopathy:    He has no cervical adenopathy.  Neurological: He is alert and oriented to person, place, and time. No sensory deficit. He exhibits normal muscle tone.  Skin: Skin is warm and dry. Capillary refill takes less than 2 seconds. No rash noted.  Psychiatric: He has a normal mood and affect. His behavior is normal.  Vitals reviewed.    ASSESSMENT & PLAN: [name] was seen today for follow-up.  Diagnoses and all orders for this visit:  Other acute nonsuppurative otitis media of both ears, recurrence not specified -     amoxicillin-clavulanate (AUGMENTIN) 875-125 MG tablet; Take 1 tablet by mouth 2 (two) times daily.  Dyslipidemia -     Lipid panel  History of kidney cancer -     US Abdomen Complete;  Future -     CBC with Differential/Platelet -     Comprehensive metabolic panel  H/O partial nephrectomy -     US Abdomen Complete; Future  History of gallstones -     US Abdomen Complete; Future    Patient Instructions       IF you received an x-ray today, you will receive an invoice from [facility]. Please contact [facility] at [phone] with questions or concerns regarding your invoice.   IF you received labwork today, you will receive an invoice from [place]. Please contact [facility] at [phone] with questions or concerns regarding your invoice.   Our billing staff will not be able to assist you with questions regarding bills from these companies.  You will be contacted with the lab results as soon as they are available. The fastest way to get your results is to activate your My Chart account. Instructions are located  on the last page of this paperwork. If you have not heard from [place] regarding the results in 2 weeks, please contact this office.     Vim tai gi?a, Ng??i l?n (Otitis Media, Adult) Vim tai gi?a l hi?n t??ng t?y ??, ?au nh?c v vim ? tai gi?a. Vim tai gi?a c th? do d? ?ng, ho?c ph? bi?n nh?t l do nhi?m trng gy ra. B?nh th??ng x?y ra d??i d?ng bi?n ch?ng c?a c?m l?nh thng th??ng. D?U HI?U V TRI?U CH?NG Tri?u ch?ng c?a vim tai gi?a c th? bao g?m:  ?au tai.  S?t.  Ti?ng  trong tai.  ?au ??u.  R r? d?ch t? tai. CH?N ?ON ?? ch?n ?on vim tai gi?a, chuyn gia ch?m [place] s?c kh?e s? ki?m tra tai qu v? b?ng m?t ?ng soi tai. ?y l m?t d?ng c? cho php chuyn gia ch?m [place] s?c kh?e nhn vo trong tai c?a qu v? ?? ki?m tra mng nh?Paulino Rily gia ch?m [place] s?c kh?e c?ng s? h?i qu v? v? cc tri?u ch?ng c?a qu v?. ?I?U TR? Vim tai gi?a th??ng t? kh?i trong vng 3 - 5 ngy. Chuyn gia ch?m [place] s?c kh?e c?a qu v? c th? k ??n thu?c ?? gi?m cc tri?u ch?ng ?au. N?u vim tai gi?a khng kh?i trong vng 5 ngy ho?c ti pht, chuyn  gia ch?m [place] s?c kh?e c th? s? k ??n thu?c khng sinh n?u h? nghi ng? nguyn nhn l do nhi?m vi khu?n. H??NG D?N CH?M [place] T?I NH  N?u qu v? ???c k ??n dng thu?c khng sinh, hy dng h?t thu?c ngay c? khi qu v? b?t ??u c?m th?y ?? h?n.  Ch? s? d?ng thu?c theo ch? d?n c?a chuyn gia ch?m [place] s?c kh?e.  Tun th? t?t c? cc cu?c h?n khm l?i theo ch? d?n c?a chuyn gia ch?m [place] s?c kh?e.  ?I KHM N?U:  Qu v? ch? b? vim tai gi?a ? m?t tai, ho?c ch?y mu m?i, ho?c c? hai.  Qu v? th?y m?t kh?i u trn c? c?a qu v?.  Qu v? khng ?? h?n trong 3-5 ngy.  Qu v? c?m th?y t? h?n thay v t?t h?n.  NGAY L?P T?C ?I KHM N?U:  Qu v? b? ?au khng ki?m sot ???c b?ng thu?c.  Qu v? b? s?ng, ??, ho?c ?au xung quanh tai ho?c c?ng ? c?.  Qu v? th?y m?t ph?n khun m?t c?a qu v? b? t li?t.  Qu v? th?y x??ng pha sau tai c?a qu v? (x??ng ch?m) nh?y c?m ?au khi ch?m vo.  ??M B?O QU V?:  Hi?u r cc h??ng d?n ny.  S? theo di tnh tr?ng c?a mnh.  S? yu c?u tr? gip ngay l?p t?c n?u qu v? c?m th?y khng kh?e ho?c th?y tr?m tr?ng h?n.  Thng tin ny khng nh?m m?c ?ch thay th? cho l?i khuyn m chuyn gia ch?m [place] s?c kh?e ni v?i qu v?. Hy b?o ??m qu v? ph?i th?o lu?n b?t k? v?n ?? g m qu v? c v?i chuyn gia ch?m  s?c kh?e c?a qu v?. Document Released: 06/30/2015 Document Revised: 06/30/2015 Document Reviewed: 12/29/2012 Elsevier Interactive Patient Education  2017 Elsevier Inc.      Agustina Caroli, MD [facility]

## 2017-02-15 LAB — CBC WITH DIFFERENTIAL/PLATELET
BASOS: 1 %
Basophils Absolute: 0 10*3/uL (ref 0.0–0.2)
EOS (ABSOLUTE): 0.2 10*3/uL (ref 0.0–0.4)
Eos: 3 %
HEMATOCRIT: 47.3 % (ref 37.5–51.0)
HEMOGLOBIN: 16.1 g/dL (ref 13.0–17.7)
IMMATURE GRANS (ABS): 0 10*3/uL (ref 0.0–0.1)
Immature Granulocytes: 0 %
LYMPHS ABS: 2.3 10*3/uL (ref 0.7–3.1)
Lymphs: 42 %
MCH: 29.8 pg (ref 26.6–33.0)
MCHC: 34 g/dL (ref 31.5–35.7)
MCV: 88 fL (ref 79–97)
MONOCYTES: 9 %
Monocytes Absolute: 0.5 10*3/uL (ref 0.1–0.9)
NEUTROS ABS: 2.5 10*3/uL (ref 1.4–7.0)
Neutrophils: 45 %
Platelets: 221 10*3/uL (ref 150–379)
RBC: 5.4 x10E6/uL (ref 4.14–5.80)
RDW: 13.5 % (ref 12.3–15.4)
WBC: 5.5 10*3/uL (ref 3.4–10.8)

## 2017-02-15 LAB — LIPID PANEL
CHOLESTEROL TOTAL: 216 mg/dL — AB (ref 100–199)
Chol/HDL Ratio: 5.5 ratio — ABNORMAL HIGH (ref 0.0–5.0)
HDL: 39 mg/dL — AB (ref >39)
LDL Calculated: 144 mg/dL — ABNORMAL HIGH (ref 0–99)
Triglycerides: 164 mg/dL — ABNORMAL HIGH (ref 0–149)
VLDL CHOLESTEROL CAL: 33 mg/dL (ref 5–40)

## 2017-02-15 LAB — COMPREHENSIVE METABOLIC PANEL
A/G RATIO: 1.5 (ref 1.2–2.2)
ALK PHOS: 83 IU/L (ref 39–117)
ALT: 34 IU/L (ref 0–44)
AST: 34 IU/L (ref 0–40)
Albumin: 4.7 g/dL (ref 3.6–4.8)
BILIRUBIN TOTAL: 0.5 mg/dL (ref 0.0–1.2)
BUN/Creatinine Ratio: 19 (ref 10–24)
BUN: 17 mg/dL (ref 8–27)
CHLORIDE: 102 mmol/L (ref 96–106)
CO2: 23 mmol/L (ref 20–29)
Calcium: 9.4 mg/dL (ref 8.6–10.2)
Creatinine, Ser: 0.9 mg/dL (ref 0.76–1.27)
GFR calc non Af Amer: 89 mL/min/{1.73_m2} (ref >59)
GFR, EST AFRICAN AMERICAN: 103 mL/min/{1.73_m2} (ref >59)
GLOBULIN, TOTAL: 3.2 g/dL (ref 1.5–4.5)
Glucose: 103 mg/dL — ABNORMAL HIGH (ref 65–99)
Potassium: 4.2 mmol/L (ref 3.5–5.2)
SODIUM: 145 mmol/L — AB (ref 134–144)
TOTAL PROTEIN: 7.9 g/dL (ref 6.0–8.5)

## 2017-02-19 ENCOUNTER — Encounter: Payer: Self-pay | Admitting: Radiology

## 2017-05-16 ENCOUNTER — Ambulatory Visit: Payer: BLUE CROSS/BLUE SHIELD | Admitting: Emergency Medicine

## 2017-05-16 ENCOUNTER — Other Ambulatory Visit: Payer: Self-pay

## 2017-05-16 VITALS — BP 104/60 | HR 77 | Temp 98.5°F | Resp 16 | Ht 65.0 in | Wt 134.6 lb

## 2017-05-16 DIAGNOSIS — G8929 Other chronic pain: Secondary | ICD-10-CM

## 2017-05-16 DIAGNOSIS — Z1211 Encounter for screening for malignant neoplasm of colon: Secondary | ICD-10-CM

## 2017-05-16 DIAGNOSIS — Z85528 Personal history of other malignant neoplasm of kidney: Secondary | ICD-10-CM

## 2017-05-16 DIAGNOSIS — E785 Hyperlipidemia, unspecified: Secondary | ICD-10-CM

## 2017-05-16 DIAGNOSIS — M25511 Pain in right shoulder: Secondary | ICD-10-CM

## 2017-05-16 LAB — POCT URINALYSIS DIP (MANUAL ENTRY)
Bilirubin, UA: NEGATIVE
Glucose, UA: NEGATIVE mg/dL
Ketones, POC UA: NEGATIVE mg/dL
LEUKOCYTES UA: NEGATIVE
NITRITE UA: NEGATIVE
PH UA: 5.5 (ref 5.0–8.0)
PROTEIN UA: NEGATIVE mg/dL
Spec Grav, UA: >=1.03 — AB (ref 1.010–1.025)
UROBILINOGEN UA: 0.2 U/dL

## 2017-05-16 NOTE — Progress Notes (Signed)
Tyler Carter 66 y.o.   Chief Complaint  Patient presents with  . Follow-up    KIDNEY CANCER  . Shoulder Pain    RIGHT x 6 months    HISTORY OF PRESENT ILLNESS: This is a 66 y.o. male complaining of right shoulder pain x 6 months; has h/o kidney CA s/p nephrectomy; no complaints. Seen by me 5/14 & 5/29; kidney U/S requested but never done; language barrier that perhaps prevented scheduling; using interpreter Tram for this visit.  HPI   Prior to Admission medications   Medication Sig Start Date End Date Taking? Authorizing Provider  betamethasone valerate ointment (VALISONE) 0.1 % Apply 1 application topically 2 (two) times daily. 03/19/16   Sherren MochaShaw, Tyler N, MD  cetirizine (ZYRTEC) 10 MG tablet Take 1 tablet (10 mg total) by mouth at bedtime. 03/19/16   Sherren MochaShaw, Tyler N, MD  fluticasone (FLONASE) 50 MCG/ACT nasal spray Place 2 sprays into both nostrils at bedtime. 03/19/16   Sherren MochaShaw, Tyler N, MD  metroNIDAZOLE (METROGEL) 0.75 % gel Apply a small amount of the gel to the affected areas before bedtime once daily 12/03/16   Tyler NajjarHopper, Tyler H, MD  pravastatin (PRAVACHOL) 40 MG tablet Take 1 tablet (40 mg total) by mouth daily. 11/12/16   Tyler QuintSagardia, Tyler Sawaya Jose, MD  Tenofovir Alafenamide Fumarate (VEMLIDY) 25 MG TABS Take 1 tablet (25 mg total) by mouth daily. 11/08/15   Tyler SmartHatcher, Tyler C, MD    No Known Allergies  Patient Active Problem List   Diagnosis Date Noted  . Bilateral otitis media 02/14/2017  . H/O partial nephrectomy 11/12/2016  . History of kidney cancer 11/12/2016  . Dyslipidemia 11/12/2016  . Routine general medical examination at a health care facility 11/12/2016  . History of gallstones 11/12/2016  . Erectile dysfunction 04/06/2015  . Tobacco use disorder 04/06/2015  . Hyperlipidemia 10/01/2014  . Hep B w/o coma 09/10/2014    Past Medical History:  Diagnosis Date  . Chronic kidney disease   . Hepatitis B   . Tobacco abuse     Past Surgical History:  Procedure Laterality Date  .  KIDNEY SURGERY Right 06/07/2016   partial nephrectomy    Social History   Socioeconomic History  . Marital status: Married    Spouse name: Not on file  . Number of children: Not on file  . Years of education: Not on file  . Highest education level: Not on file  Social Needs  . Financial resource strain: Not on file  . Food insecurity - worry: Not on file  . Food insecurity - inability: Not on file  . Transportation needs - medical: Not on file  . Transportation needs - non-medical: Not on file  Occupational History  . Not on file  Tobacco Use  . Smoking status: Current Every Day Smoker    Packs/day: 0.50    Years: 20.00    Pack years: 10.00    Types: Cigarettes  . Smokeless tobacco: Never Used  . Tobacco comment: would like to quit states "can not"  Substance and Sexual Activity  . Alcohol use: No    Alcohol/week: 0.0 oz  . Drug use: No  . Sexual activity: Not on file  Other Topics Concern  . Not on file  Social History Narrative  . Not on file    Family History  Problem Relation Age of Onset  . Cancer Father        throat cancer     Review of Systems  Constitutional: Negative.  Negative for chills, fever, malaise/fatigue and weight loss.  HENT: Negative.  Negative for congestion, nosebleeds, sinus pain and sore throat.   Eyes: Negative.  Negative for discharge and redness.  Respiratory: Negative.  Negative for cough and shortness of breath.   Cardiovascular: Negative.  Negative for chest pain and palpitations.  Gastrointestinal: Negative.  Negative for abdominal pain, blood in stool, diarrhea, melena, nausea and vomiting.  Genitourinary: Negative for dysuria and hematuria.  Musculoskeletal: Positive for joint pain (right shoulder and elbow).  Skin: Negative.  Negative for rash.  Neurological: Negative.  Negative for dizziness and headaches.  Endo/Heme/Allergies: Negative.   All other systems reviewed and are negative.  Vitals:   05/16/17 0829  BP: 104/60   Pulse: 77  Resp: 16  Temp: 98.5 F (36.9 C)  SpO2: 97%     Physical Exam  Constitutional: He appears well-developed and well-nourished.  HENT:  Head: Normocephalic and atraumatic.  Right Ear: External ear normal.  Left Ear: External ear normal.  Nose: Nose normal.  Mouth/Throat: Oropharynx is clear and moist.  Eyes: Conjunctivae and EOM are normal. Pupils are equal, round, and reactive to light.  Neck: Normal range of motion. Neck supple. No JVD present.  Cardiovascular: Normal rate, regular rhythm, normal heart sounds and intact distal pulses.  Pulmonary/Chest: Effort normal and breath sounds normal.  Abdominal: Soft. Bowel sounds are normal. He exhibits no distension. There is no tenderness.  Musculoskeletal:  Right shoulder: FROM, [facility] Right Elbow: mild tenderness to medial epicondyle; FROM  Lymphadenopathy:    He has no cervical adenopathy.  Neurological: He is alert. No sensory deficit. He exhibits normal muscle tone.  Skin: Skin is warm and dry. Capillary refill takes less than 2 seconds. No rash noted.  Psychiatric: He has a normal mood and affect. His behavior is normal.  Vitals reviewed.    ASSESSMENT & PLAN: Tyler Decemberanh was seen today for follow-up and shoulder pain.  Diagnoses and all orders for this visit:  Chronic right shoulder pain  Screening for colon cancer -     Ambulatory referral to Gastroenterology  History of kidney cancer -     CBC with Differential/Platelet -     Comprehensive metabolic panel -     POCT urinalysis dipstick  Dyslipidemia -     Lipid panel  Renal ultrasound scheduled for next Wednesday am.   Patient Instructions   You have an appointment at [facility] for Ultrasound of the Abdomen. The appointment is Wednesday 05/21/2017 at 8:45 am, please arrive 15 minutes early. Do not eat or drink anything 6 hours prior to test. [place] [address] [place] Phone number is [phone], they will  have an interpret to translate.    IF you received an x-ray today, you will receive an invoice from [facility]. Please contact [facility] at [phone] with questions or concerns regarding your invoice.   IF you received labwork today, you will receive an invoice from [facility]. Please contact [facility] at [phone] with questions or concerns regarding your invoice.   Our billing staff will not be able to assist you with questions regarding bills from these companies.  You will be contacted with the lab results as soon as they are available. The fastest way to get your results is to activate your My Chart account. Instructions are located on the last page of this paperwork. If you have not heard from us regarding the results in 2 weeks, please contact this  office.     ?au Vai (Shoulder Pain) C nhi?u nguyn nhn gy ?au vai, bao g?m:  M?t th??ng t?Carter c?a vng vai.  S? d?ng vai qu m?c.  Vim kh?p. Ngu?Carter g?c c?Carter ?au c th? l:  Vim.  M?t t?Carter th??ng c?a kh?p vai.  M?t t?Carter th??ng c?a gn, dy ch?ng ho?c x??ng. H??NG D?Carter CH?M [place] T?I NH Ti?Carter hnh nh?ng hnh ??ng sau ?? gip gi?m ?au:  Bp qu? bng m?m ho?c t?m b?t bi?Carter cng nhi?u cng t?t. Vi?c ny gip cho vai khng b? s?ng. Carter c?ng gip lm t?ng s?c b?Carter c?a cnh tay.  Ch? s? d?ng thu?c khng c?Carter k ??Carter v thu?c c?Carter k ??Carter theo ch? d?Carter c?a chuyn gia ch?m [place] s?c kh?e.  Carter?u ???c ch? d?Carter, hy ch??m ? l?nh vo vng b? ?au: ? Cho ? l?nh vo ti nh?a. ? ?? kh?Carter t?m ? gi?a da v ti ch??m. ? Ch??m ? l?nh trong 20 pht, 2-3 l?Carter m?i ngy. D?ng ch??m ? Carter?u vi?c ny khng lm gi?m ?au.  Carter?u qu v? ???c cho dng dy ?eo ho?c v?t c? ??nh vai: ? ?eo theo ch? d?Carter. ? Tho ra ?? t?m. ? C? ??ng cnh tay cng t cng t?t, nh?ng lun c? ??ng bn tay c?a qu v? ?? trnh s?ng Carter?. ?I KHM Carter?U:  ?au tr? nn tr?m tr?ng h?Carter.  Qu v? khng h?t ?au sau khi dng thu?c.  ?au m?i xu?t hi?Carter ? cnh tay, bn tay ho?c ngn  tay c?a qu v?.  NGAY L?P T?C ?I KHM Carter?U:  Cnh tay, bn tay ho?c ngn tay qu v?: ? ?au bu?t. ? Tr? nn t b. ? Tr? nn s?ng Carter?. ? Tr? nn ?au. ? Chuy?Carter sang mu tr?ng ho?c mu xanh.  Thng tin ny khng nh?m m?c ?ch thay th? cho l?i khuyn m chuyn gia ch?m [place] s?c kh?e ni v?i qu v?. Hy b?o ??m qu v? ph?i th?o lu?Carter b?t k? v?Carter ?? g m qu v? c v?i chuyn gia ch?m [place] s?c kh?e c?a qu v?. Document Released: 12/03/2011 Document Revised: 09/25/2015 Document Reviewed: 09/26/2014 Elsevier Interactive Patient Education  2017 Elsevier Inc.      Edwina Barth, MD [facility]

## 2017-05-16 NOTE — Patient Instructions (Addendum)
You have an appointment at [facility] for Ultrasound of the Abdomen. The appointment is Wednesday 05/21/2017 at 8:45 am, please arrive 15 minutes early. Do not eat or drink anything 6 hours prior to test. [place] [address] [place] Phone number is [phone], they will have an interpret to translate.    IF you received an x-ray today, you will receive an invoice from [facility]. Please contact [facility] at [phone] with questions or concerns regarding your invoice.   IF you received labwork today, you will receive an invoice from [facility]. Please contact [facility] at [phone] with questions or concerns regarding your invoice.   Our billing staff will not be able to assist you with questions regarding bills from these companies.  You will be contacted with the lab results as soon as they are available. The fastest way to get your results is to activate your My Chart account. Instructions are located on the last page of this paperwork. If you have not heard from us regarding the results in 2 weeks, please contact this office.     ?au Vai (Shoulder Pain) C nhi?u nguyn nhn gy ?au vai, bao g?m:  M?t th??ng t?n c?a vng vai.  S? d?ng vai qu m?c.  Vim kh?p. Ngu?n g?c c?n ?au c th? l:  Vim.  M?t t?n th??ng c?a kh?p vai.  M?t t?n th??ng c?a gn, dy ch?ng ho?c x??ng. H??NG D?N CH?M [place] T?I NH Ti?n hnh nh?ng hnh ??ng sau ?? gip gi?m ?au:  Bp qu? bng m?m ho?c t?m b?t bi?n cng nhi?u cng t?t. Vi?c ny gip cho vai khng b? s?ng. N c?ng gip lm t?ng s?c b?n c?a cnh tay.  Ch? s? d?ng thu?c khng c?n k ??n v thu?c c?n k ??n theo ch? d?n c?a chuyn gia ch?m [place] s?c kh?e.  N?u ???c ch? d?n, hy ch??m ? l?nh vo vng b? ?au: ? Cho ? l?nh vo ti nh?a. ? ?? kh?n t?m ? gi?a da v ti ch??m. ? Ch??m ? l?nh trong 20 pht, 2-3 l?n m?i ngy. D?ng ch??m ? n?u vi?c ny khng lm gi?m ?au.  N?u qu v? ???c cho dng dy ?eo ho?c  v?t c? ??nh vai: ? ?eo theo ch? d?n. ? Tho ra ?? t?m. ? C? ??ng cnh tay cng t cng t?t, nh?ng lun c? ??ng bn tay c?a qu v? ?? trnh s?ng n?. ?I KHM N?U:  ?au tr? nn tr?m tr?ng h?n.  Qu v? khng h?t ?au sau khi dng thu?c.  ?au m?i xu?t hi?n ? cnh tay, bn tay ho?c ngn tay c?a qu v?.  NGAY L?P T?C ?I KHM N?U:  Cnh tay, bn tay ho?c ngn tay qu v?: ? ?au bu?t. ? Tr? nn t b. ? Tr? nn s?ng n?. ? Tr? nn ?au. ? Chuy?n sang mu tr?ng ho?c mu xanh.  Thng tin ny khng nh?m m?c ?ch thay th? cho l?i khuyn m chuyn gia ch?m [place] s?c kh?e ni v?i qu v?. Hy b?o ??m qu v? ph?i th?o lu?n b?t k? v?n ?? g m qu v? c v?i chuyn gia ch?m [place] s?c kh?e c?a qu v?. Document Released: 12/03/2011 Document Revised: 09/25/2015 Document Reviewed: 09/26/2014 Elsevier Interactive Patient Education  2017 [employer]

## 2017-05-17 LAB — CBC WITH DIFFERENTIAL/PLATELET
BASOS ABS: 0 10*3/uL (ref 0.0–0.2)
Basos: 0 %
EOS (ABSOLUTE): 0.2 10*3/uL (ref 0.0–0.4)
Eos: 5 %
HEMOGLOBIN: 15.6 g/dL (ref 13.0–17.7)
Hematocrit: 45.7 % (ref 37.5–51.0)
Immature Grans (Abs): 0 10*3/uL (ref 0.0–0.1)
Immature Granulocytes: 0 %
LYMPHS ABS: 2 10*3/uL (ref 0.7–3.1)
Lymphs: 45 %
MCH: 30 pg (ref 26.6–33.0)
MCHC: 34.1 g/dL (ref 31.5–35.7)
MCV: 88 fL (ref 79–97)
MONOCYTES: 8 %
Monocytes Absolute: 0.4 10*3/uL (ref 0.1–0.9)
Neutrophils Absolute: 1.9 10*3/uL (ref 1.4–7.0)
Neutrophils: 42 %
PLATELETS: 226 10*3/uL (ref 150–379)
RBC: 5.2 x10E6/uL (ref 4.14–5.80)
RDW: 13.4 % (ref 12.3–15.4)
WBC: 4.5 10*3/uL (ref 3.4–10.8)

## 2017-05-17 LAB — COMPREHENSIVE METABOLIC PANEL
ALK PHOS: 88 IU/L (ref 39–117)
ALT: 73 IU/L — AB (ref 0–44)
AST: 51 IU/L — AB (ref 0–40)
Albumin/Globulin Ratio: 1.6 (ref 1.2–2.2)
Albumin: 4.5 g/dL (ref 3.6–4.8)
BUN/Creatinine Ratio: 20 (ref 10–24)
BUN: 15 mg/dL (ref 8–27)
Bilirubin Total: 0.5 mg/dL (ref 0.0–1.2)
CO2: 24 mmol/L (ref 20–29)
CREATININE: 0.76 mg/dL (ref 0.76–1.27)
Calcium: 9.4 mg/dL (ref 8.6–10.2)
Chloride: 103 mmol/L (ref 96–106)
GFR calc Af Amer: 111 mL/min/{1.73_m2} (ref >59)
GFR calc non Af Amer: 96 mL/min/{1.73_m2} (ref >59)
GLUCOSE: 91 mg/dL (ref 65–99)
Globulin, Total: 2.9 g/dL (ref 1.5–4.5)
Potassium: 4 mmol/L (ref 3.5–5.2)
Sodium: 141 mmol/L (ref 134–144)
Total Protein: 7.4 g/dL (ref 6.0–8.5)

## 2017-05-17 LAB — LIPID PANEL
CHOLESTEROL TOTAL: 202 mg/dL — AB (ref 100–199)
Chol/HDL Ratio: 4.4 ratio (ref 0.0–5.0)
HDL: 46 mg/dL (ref >39)
LDL Calculated: 137 mg/dL — ABNORMAL HIGH (ref 0–99)
Triglycerides: 96 mg/dL (ref 0–149)
VLDL CHOLESTEROL CAL: 19 mg/dL (ref 5–40)

## 2017-05-19 ENCOUNTER — Telehealth: Payer: Self-pay | Admitting: General Practice

## 2017-05-19 NOTE — Telephone Encounter (Signed)
Copied from CRM [phone]#[id number]. Topic: Quick Communication - See Telephone Encounter >> May 19, 2017 10:46 AM Landry MellowFoltz, Melissa J wrote: CRM for notification. See Telephone encounter for:  05/19/17. Pt is calling to request medication for shoulder pain. He uses [facility] on [address]  Cb num is [phone]  Also, [facility] [address], that is far away from his house.

## 2017-05-20 ENCOUNTER — Encounter: Payer: Self-pay | Admitting: Radiology

## 2017-05-20 NOTE — Telephone Encounter (Signed)
Attempted to call patient with the interpreter but pt did not answer. No message left.

## 2017-05-21 ENCOUNTER — Ambulatory Visit (HOSPITAL_COMMUNITY)
Admission: RE | Admit: 2017-05-21 | Discharge: 2017-05-21 | Disposition: A | Discharge status: Home / Self Care | Payer: BLUE CROSS/BLUE SHIELD | Source: Ambulatory Visit | Attending: Emergency Medicine | Admitting: Emergency Medicine

## 2017-05-21 DIAGNOSIS — K824 Cholesterolosis of gallbladder: Secondary | ICD-10-CM | POA: Diagnosis not present

## 2017-05-21 DIAGNOSIS — Z8719 Personal history of other diseases of the digestive system: Secondary | ICD-10-CM | POA: Insufficient documentation

## 2017-05-21 DIAGNOSIS — N281 Cyst of kidney, acquired: Secondary | ICD-10-CM | POA: Insufficient documentation

## 2017-05-21 DIAGNOSIS — Z85528 Personal history of other malignant neoplasm of kidney: Secondary | ICD-10-CM

## 2017-05-21 DIAGNOSIS — Z905 Acquired absence of kidney: Secondary | ICD-10-CM | POA: Diagnosis not present

## 2017-05-22 ENCOUNTER — Encounter: Payer: Self-pay | Admitting: Radiology

## 2017-06-24 ENCOUNTER — Telehealth: Payer: Self-pay | Admitting: Emergency Medicine

## 2017-06-24 NOTE — Telephone Encounter (Signed)
Tried to call pt to remind him of the appt in the morning 06/25/17

## 2017-06-25 ENCOUNTER — Encounter: Payer: Self-pay | Admitting: Emergency Medicine

## 2017-06-25 ENCOUNTER — Ambulatory Visit: Payer: BLUE CROSS/BLUE SHIELD | Admitting: Emergency Medicine

## 2017-06-25 VITALS — BP 126/71 | HR 73 | Temp 98.4°F | Resp 18 | Ht 65.0 in | Wt 133.2 lb

## 2017-06-25 DIAGNOSIS — G8929 Other chronic pain: Secondary | ICD-10-CM

## 2017-06-25 DIAGNOSIS — Z85528 Personal history of other malignant neoplasm of kidney: Secondary | ICD-10-CM | POA: Diagnosis not present

## 2017-06-25 DIAGNOSIS — M25511 Pain in right shoulder: Secondary | ICD-10-CM | POA: Diagnosis not present

## 2017-06-25 DIAGNOSIS — M2550 Pain in unspecified joint: Secondary | ICD-10-CM | POA: Diagnosis not present

## 2017-06-25 DIAGNOSIS — Z905 Acquired absence of kidney: Secondary | ICD-10-CM | POA: Diagnosis not present

## 2017-06-25 NOTE — Patient Instructions (Addendum)
     IF you received an x-ray today, you will receive an invoice from [facility]. Please contact [facility] at [phone] with questions or concerns regarding your invoice.   IF you received labwork today, you will receive an invoice from [facility]. Please contact [facility] at [phone] with questions or concerns regarding your invoice.   Our billing staff will not be able to assist you with questions regarding bills from these companies.  You will be contacted with the lab results as soon as they are available. The fastest way to get your results is to activate your My Chart account. Instructions are located on the last page of this paperwork. If you have not heard from us regarding the results in 2 weeks, please contact this office.     Joint Pain Joint pain can be caused by many things. The joint can be bruised, infected, weak from aging, or sore from exercise. The pain will probably go away if you follow your doctor's instructions for home care. If your joint pain continues, more tests may be needed to help find the cause of your condition. Follow these instructions at home: Watch your condition for any changes. Follow these instructions as told to lessen the pain that you are feeling:  Take medicines only as told by your doctor.  Rest the sore joint for as long as told by your doctor. If your doctor tells you to, raise (elevate) the painful joint above the level of your heart while you are sitting or lying down.  Do not do things that cause pain or make the pain worse.  If told, put ice on the painful area: ? Put ice in a plastic bag. ? Place a towel between your skin and the bag. ? Leave the ice on for 20 minutes, 2-3 times per day.  Wear an elastic bandage, splint, or sling as told by your doctor. Loosen the bandage or splint if your fingers or toes lose feeling (become numb) and tingle, or if they turn cold and blue.  Begin exercising or stretching the  joint as told by your doctor. Ask your doctor what types of exercise are safe for you.  Keep all follow-up visits as told by your doctor. This is important.  Contact a doctor if:  Your pain gets worse and medicine does not help it.  Your joint pain does not get better in 3 days.  You have more bruising or swelling.  You have a fever.  You lose 10 pounds (4.5 kg) or more without trying. Get help right away if:  You are not able to move the joint.  Your fingers or toes become numb or they turn cold and blue. This information is not intended to replace advice given to you by your health care provider. Make sure you discuss any questions you have with your health care provider. Document Released: 05/22/2009 Document Revised: 11/09/2015 Document Reviewed: 03/15/2014 Elsevier Interactive Patient Education  2018 Elsevier Inc.  

## 2017-06-25 NOTE — Progress Notes (Signed)
Tyler Carter 67 y.o.   Chief Complaint  Patient presents with  . lab results    wants to know the results of lab/ultrasound  . joint pain    states his joints(shoulder/elbow/knees) hurt    HISTORY OF PRESENT ILLNESS: This is a 67 y.o. male here for follow up visit; h/o kidney CA with partial nephrectomy right kidney; no new symptoms; [employer] interpreter used: Tyler Carter [id number]. Lab results and ultrasound report reviewed with patient.  HPI   Prior to Admission medications   Medication Sig Start Date End Date Taking? Authorizing Provider  cetirizine (ZYRTEC) 10 MG tablet Take 10 mg by mouth daily.   Yes [provider]  pravastatin (PRAVACHOL) 40 MG tablet Take 1 tablet (40 mg total) by mouth daily. Patient not taking: Reported on 06/25/2017 11/12/16   Georgina Quint, MD    No Known Allergies  Patient Active Problem List   Diagnosis Date Noted  . Bilateral otitis media 02/14/2017  . H/O partial nephrectomy 11/12/2016  . History of kidney cancer 11/12/2016  . Dyslipidemia 11/12/2016  . Routine general medical examination at a health care facility 11/12/2016  . History of gallstones 11/12/2016  . Erectile dysfunction 04/06/2015  . Tobacco use disorder 04/06/2015  . Hyperlipidemia 10/01/2014  . Hep B w/o coma 09/10/2014    Past Medical History:  Diagnosis Date  . Chronic kidney disease   . Hepatitis B   . Tobacco abuse     Past Surgical History:  Procedure Laterality Date  . KIDNEY SURGERY Right 06/07/2016   partial nephrectomy    Social History   Socioeconomic History  . Marital status: Married    Spouse name: Not on file  . Number of children: Not on file  . Years of education: Not on file  . Highest education level: Not on file  Social Needs  . Financial resource strain: Not on file  . Food insecurity - worry: Not on file  . Food insecurity - inability: Not on file  . Transportation needs - medical: Not on file  . Transportation needs -  non-medical: Not on file  Occupational History  . Not on file  Tobacco Use  . Smoking status: Current Every Day Smoker    Packs/day: 0.50    Years: 20.00    Pack years: 10.00    Types: Cigarettes  . Smokeless tobacco: Never Used  . Tobacco comment: would like to quit states "can not"  Substance and Sexual Activity  . Alcohol use: No    Alcohol/week: 0.0 oz  . Drug use: No  . Sexual activity: Not on file  Other Topics Concern  . Not on file  Social History Narrative  . Not on file    Family History  Problem Relation Age of Onset  . Cancer Father        throat cancer     Review of Systems  Constitutional: Negative.  Negative for chills, fever and weight loss.  HENT: Negative.  Negative for congestion, nosebleeds and sore throat.   Eyes: Negative.   Respiratory: Negative.  Negative for cough and shortness of breath.   Cardiovascular: Negative.  Negative for chest pain and palpitations.  Gastrointestinal: Negative for abdominal pain, blood in stool, diarrhea, heartburn, nausea and vomiting.  Genitourinary: Negative.  Negative for dysuria, flank pain, frequency and hematuria.  Musculoskeletal: Positive for joint pain (multiple).  Skin: Negative.  Negative for rash.  Neurological: Negative.   Endo/Heme/Allergies: Negative.   All other systems reviewed and are  negative.  Vitals:   06/25/17 0833  BP: 126/71  Pulse: 73  Resp: 18  Temp: 98.4 F (36.9 C)  SpO2: 98%     Physical Exam  Constitutional: He is oriented to person, place, and time. He appears well-developed and well-nourished.  HENT:  Head: Normocephalic and atraumatic.  Right Ear: External ear normal.  Left Ear: External ear normal.  Nose: Nose normal.  Mouth/Throat: Oropharynx is clear and moist.  Eyes: Conjunctivae and EOM are normal. Pupils are equal, round, and reactive to light.  Neck: Normal range of motion. Neck supple.  Cardiovascular: Normal rate, regular rhythm and normal heart sounds.    Pulmonary/Chest: Effort normal and breath sounds normal.  Abdominal: Soft. Bowel sounds are normal. He exhibits no distension. There is no tenderness.  Musculoskeletal: Normal range of motion. He exhibits no edema or tenderness.  Right shoulder: FROM, no swelling  Neurological: He is alert and oriented to person, place, and time. No sensory deficit. He exhibits normal muscle tone.  Skin: Skin is warm and dry. Capillary refill takes less than 2 seconds. No rash noted.  Psychiatric: He has a normal mood and affect. His behavior is normal.  Vitals reviewed.  A total of 25 minutes was spent in the room with the patient, greater than 50% of which was in counseling/coordination of care.   ASSESSMENT & PLAN: Tyler Carter was seen today for lab results and joint pain.  Diagnoses and all orders for this visit:  Chronic right shoulder pain  Arthralgia, unspecified joint  History of kidney cancer  H/O partial nephrectomy    Patient Instructions       IF you received an x-ray today, you will receive an invoice from [facility]. Please contact [facility] at [phone] with questions or concerns regarding your invoice.   IF you received labwork today, you will receive an invoice from [facility]. Please contact [facility] at [phone] with questions or concerns regarding your invoice.   Our billing staff will not be able to assist you with questions regarding bills from these companies.  You will be contacted with the lab results as soon as they are available. The fastest way to get your results is to activate your My Chart account. Instructions are located on the last page of this paperwork. If you have not heard from us regarding the results in 2 weeks, please contact this office.      Joint Pain Joint pain can be caused by many things. The joint can be bruised, infected, weak from aging, or sore from exercise. The pain will probably go away if you follow your doctor's  instructions for home care. If your joint pain continues, more tests may be needed to help find the cause of your condition. Follow these instructions at home: Watch your condition for any changes. Follow these instructions as told to lessen the pain that you are feeling:  Take medicines only as told by your doctor.  Rest the sore joint for as long as told by your doctor. If your doctor tells you to, raise (elevate) the painful joint above the level of your heart while you are sitting or lying down.  Do not do things that cause pain or make the pain worse.  If told, put ice on the painful area: ? Put ice in a plastic bag. ? Place a towel between your skin and the bag. ? Leave the ice on for 20 minutes, 2-3 times per day.  Wear an elastic bandage, splint, or sling  as told by your doctor. Loosen the bandage or splint if your fingers or toes lose feeling (become numb) and tingle, or if they turn cold and blue.  Begin exercising or stretching the joint as told by your doctor. Ask your doctor what types of exercise are safe for you.  Keep all follow-up visits as told by your doctor. This is important.  Contact a doctor if:  Your pain gets worse and medicine does not help it.  Your joint pain does not get better in 3 days.  You have more bruising or swelling.  You have a fever.  You lose 10 pounds (4.5 kg) or more without trying. Get help right away if:  You are not able to move the joint.  Your fingers or toes become numb or they turn cold and blue. This information is not intended to replace advice given to you by your health care provider. Make sure you discuss any questions you have with your health care provider. Document Released: 05/22/2009 Document Revised: 11/09/2015 Document Reviewed: 03/15/2014 Elsevier Interactive Patient Education  2018 Elsevier Inc.      Edwina Barth, MD [facility]

## 2017-07-31 ENCOUNTER — Encounter: Payer: Self-pay | Admitting: Emergency Medicine

## 2017-08-26 ENCOUNTER — Encounter: Payer: Self-pay | Admitting: Emergency Medicine

## 2017-08-26 ENCOUNTER — Ambulatory Visit: Payer: BLUE CROSS/BLUE SHIELD | Admitting: Emergency Medicine

## 2017-08-26 VITALS — BP 127/77 | HR 90 | Temp 98.4°F | Resp 17 | Ht 65.5 in | Wt 134.0 lb

## 2017-08-26 DIAGNOSIS — Z85528 Personal history of other malignant neoplasm of kidney: Secondary | ICD-10-CM

## 2017-08-26 DIAGNOSIS — G8929 Other chronic pain: Secondary | ICD-10-CM

## 2017-08-26 DIAGNOSIS — R51 Headache: Secondary | ICD-10-CM | POA: Diagnosis not present

## 2017-08-26 DIAGNOSIS — R519 Headache, unspecified: Secondary | ICD-10-CM

## 2017-08-26 NOTE — Progress Notes (Signed)
Tyler Carter 67 y.o.   Chief Complaint  Patient presents with  . Headache    HISTORY OF PRESENT ILLNESS: This is a 67 y.o. male with a history of chronic headaches for years.  Usually gets a morning headache that lasts about 5 minutes.  For the past couple of days headaches have been lasting 10-15 minutes.  Occipital headache with lightheaded and heaviness.  Denies any head injuries.  Denies nausea or vomiting.  Denies flulike symptoms.  Denies fever or chills.  Denies visual problems.  States lately he has been more forgetful.  No other significant symptoms.  Interpreter used for this history.  Tyler Carter #[id number].  HPI   Prior to Admission medications   Medication Sig Start Date End Date Taking? Authorizing Provider  cetirizine (ZYRTEC) 10 MG tablet Take 10 mg by mouth daily.    [provider]    No Known Allergies  Patient Active Problem List   Diagnosis Date Noted  . Bilateral otitis media 02/14/2017  . H/O partial nephrectomy 11/12/2016  . History of kidney cancer 11/12/2016  . Dyslipidemia 11/12/2016  . Routine general medical examination at a health care facility 11/12/2016  . History of gallstones 11/12/2016  . Erectile dysfunction 04/06/2015  . Tobacco use disorder 04/06/2015  . Hyperlipidemia 10/01/2014  . Hep B w/o coma 09/10/2014    Past Medical History:  Diagnosis Date  . Chronic kidney disease   . Hepatitis B   . Tobacco abuse     Past Surgical History:  Procedure Laterality Date  . KIDNEY SURGERY Right 06/07/2016   partial nephrectomy    Social History   Socioeconomic History  . Marital status: Married    Spouse name: Not on file  . Number of children: Not on file  . Years of education: Not on file  . Highest education level: Not on file  Social Needs  . Financial resource strain: Not on file  . Food insecurity - worry: Not on file  . Food insecurity - inability: Not on file  . Transportation needs - medical: Not on file  .  Transportation needs - non-medical: Not on file  Occupational History  . Not on file  Tobacco Use  . Smoking status: Current Every Day Smoker    Packs/day: 0.50    Years: 20.00    Pack years: 10.00    Types: Cigarettes  . Smokeless tobacco: Never Used  . Tobacco comment: would like to quit states "can not"  Substance and Sexual Activity  . Alcohol use: No    Alcohol/week: 0.0 oz  . Drug use: No  . Sexual activity: Not on file  Other Topics Concern  . Not on file  Social History Narrative  . Not on file    Family History  Problem Relation Age of Onset  . Cancer Father        throat cancer     Review of Systems  Constitutional: Negative.  Negative for chills, fever and malaise/fatigue.  HENT: Negative.  Negative for congestion, ear pain, hearing loss, nosebleeds and sore throat.   Eyes: Negative.  Negative for blurred vision, double vision, discharge and redness.  Respiratory: Negative.  Negative for cough and shortness of breath.   Cardiovascular: Negative.  Negative for chest pain, palpitations and leg swelling.  Gastrointestinal: Negative.  Negative for abdominal pain, diarrhea, nausea and vomiting.  Genitourinary: Negative.  Negative for hematuria.  Musculoskeletal: Negative.  Negative for back pain, myalgias and neck pain.  Skin: Negative.  Negative  for rash.  Neurological: Positive for headaches. Negative for sensory change, speech change and loss of consciousness.  All other systems reviewed and are negative.  Vitals:   08/26/17 0832  BP: 127/77  Pulse: 90  Resp: 17  Temp: 98.4 F (36.9 C)  SpO2: 98%     Physical Exam  Constitutional: He is oriented to person, place, and time. He appears well-developed and well-nourished.  HENT:  Head: Normocephalic and atraumatic.  Right Ear: External ear normal.  Left Ear: External ear normal.  Nose: Nose normal.  Mouth/Throat: Oropharynx is clear and moist.  Eyes: Conjunctivae and EOM are normal. Pupils are equal,  round, and reactive to light.  Neck: Normal range of motion. Neck supple. No JVD present. No thyromegaly present.  Cardiovascular: Normal rate, regular rhythm and normal heart sounds.  Pulmonary/Chest: Effort normal and breath sounds normal.  Abdominal: Soft. Bowel sounds are normal. He exhibits no distension. There is no tenderness.  Musculoskeletal: Normal range of motion.  Lymphadenopathy:    He has no cervical adenopathy.  Neurological: He is alert and oriented to person, place, and time. He displays normal reflexes. No cranial nerve deficit or sensory deficit. He exhibits normal muscle tone. Coordination normal.  Skin: Skin is warm and dry. Capillary refill takes less than 2 seconds. No rash noted.  Psychiatric: He has a normal mood and affect. His behavior is normal.  Vitals reviewed.  Chronic nonintractable headache Stable headache.  Chronic nature.  No red flag signs or symptoms.  No need for imaging at this time.  Advised to take Tylenol only.  Has a history of chronic kidney disease and renal cancer status post nephrectomy.  Advised to avoid aspirin or NSAIDs.  Advised to return here if symptoms change or he gets worse.    ASSESSMENT & PLAN: [name] was seen today for headache.  Diagnoses and all orders for this visit:  Chronic nonintractable headache, unspecified headache type -     Ambulatory referral to Neurology  History of kidney cancer     Patient Instructions    Take Tylenol as needed for headache.  Do not take aspirin or NSAIDs.   IF you received an x-ray today, you will receive an invoice from [facility]. Please contact [facility] at [phone] with questions or concerns regarding your invoice.   IF you received labwork today, you will receive an invoice from [place]. Please contact [facility] at [phone] with questions or concerns regarding your invoice.   Our billing staff will not be able to assist you with questions regarding bills  from these companies.  You will be contacted with the lab results as soon as they are available. The fastest way to get your results is to activate your My Chart account. Instructions are located on the last page of this paperwork. If you have not heard from [place] regarding the results in 2 weeks, please contact this office.      ?au ??u thng th???ng khng ro? nguyn nhn General Headache Without Cause ?au ??u l ?au hay kh ch?u ?? xung Holli Humbles ??u ho?c khu v??c c?. Nguyn nhn c? th? c?a ?au ??u c th? khng ???c pha?t hi?n. C nhi?u nguyn nhn v lo?i ?au ??u. M?t vi loa?i ?au ??u ph? bi?n la?:  ?au ??u c?ng th?ng.  ?au n?a ??u.  ?au ??u t??ng c?n.  ?au ??u hng ngy m?n tnh.  Tun th? nh?ng h??ng d?n ny ? nh:  Theo di tnh tr?ng c?a qu v? ?? pht  hi?n b?t k? thay ??i no. Ti?n hnh nh?ng b???c sau ?? gip ca?i thi?n ti?nh tra?ng cu?a quy? vi?: X? tr ?au   Ch? s? d?ng thu?c khng k ??n v thu?c k ??n theo ch? d?n c?a chuyn gia ch?m [place] s?c kh?e.  N?m trong m?t phng t?i, yn t?nh khi qu v? b? ?au ??u.  N?u ???c ch? d?n, ch??m ? l?nh vo vng ??u v c?: ? Cho ? l?nh vo ti ni lng. ? ?? kh?n t?m ? gi?a da v ti ch??m. ? Ch??m ? l?nh trong 20 pht, 2-3 l?n m?i ngy.  S? d?ng ??m no?ng ho?c t?m n??c nng ?? t?ng nhi?t vo vng ??u v c? theo ch? d?n c?a chuyn gia ch?m [place] s?c kh?e.  Gi? cho nh sng d?u nh? n?u nh sng m?nh lm qu v? kh ch?u v lm ch?ng ?au ??u t?i t? h?n. ?n v u?ng  ?n u?ng theo l?ch bnh th??ng.  H?n ch? u?ng r??u.  Gi?m l??ng cafein quy? vi? u?ng, ho?c ng?ng u?ng caffeine. H??ng d?n chung  Tun th? t?t c? cc l?n khm theo di theo ch? d?n c?a chuyn gia ch?m [place] s?c kh?e. ?i?u ny c vai tr quan tr?ng.  Ghi nh?t k ?au ??u hng ngy ?? gip tm ra ?i?u g c th? gy cc c?n ?au ??u. V d?, hy ghi l?i: ? Qu v? ?n v u?ng g. ? Qu v? ? ng? bao lu. ? B?t k? thay ??i no trong ch? ?? ?n ho?c thu?c men.  Th? k?  thu?t xoa bp ho?c cc k? thu?t th? gin khc.  H?n ch? c?ng th?ng.  Ng?i th?ng l?ng v khng lm c?ng cc c?.  Khng s? d?ng cc s?n ph?m thu?c l no, bao g?m thu?c l d?ng ht, thu?c l d?ng nhai ho?c thu?c l ?i?n t?. N?u qu v? c?n gip ?? ?? cai thu?c, hy h?i chuyn gia ch?m [place] s?c kh?e.  T?p th? d?c th??ng xuyn theo ch? d?n c?a chuyn gia ch?m [place] s?c kh?e.  Ng? theo m?t l?ch trnh bi?nh th??ng. Ng? 7-9 ti?ng, ho?c th?i gian ng? theo khuy?n ngh? c?a chuyn gia ch?m [place] s?c kh?e. Hy lin l?c v?i chuyn gia ch?m [place] s?c kh?e n?u:  Tri?u ch?ng c?a qu v? khng c?i thi?n ???c b?ng thu?c.  Quy? vi? b? ?au ??u khc v??i ?au ??u thng th??ng.  Qu v? b? bu?n nn ho?c qu v? nn.  Qu v? b? s?t. Yu c?u tr? gip ngay l?p t?c n?u:  Qu v? ?au ??u n?ng h?n.  Qu v? lin t?c b? nn m?a.  Qu v? b? c?ng c?.  Qu v? khng nhn th?y g.  Qu v? b? kh ni.  Qu v? b? ?au ? m?t ho?c tai.  Qu v? b? y?u c? ho?c m?t ki?m sot c?.  Qu v? m?t th?ng b?ng ho?c ?i l?i kh kh?n.  Qu v? c?m th?y mu?n ng?t ho?c ng?t.  Qu v? b? l l?n. Thng tin ny khng nh?m m?c ?ch thay th? cho l?i khuyn m chuyn gia ch?m [place] s?c kh?e ni v?i qu v?. Hy b?o ??m qu v? ph?i th?o lu?n b?t k? v?n ?? g m qu v? c v?i chuyn gia ch?m [place] s?c kh?e c?a qu v?. Document Released: 09/25/2015 Document Revised: 09/16/2016 Document Reviewed: 09/26/2014 Elsevier Interactive Patient Education  2018 Elsevier Inc.     Agustina Caroli, MD [facility]

## 2017-08-26 NOTE — Patient Instructions (Addendum)
Take Tylenol as needed for headache.  Do not take aspirin or NSAIDs.   IF you received an x-ray today, you will receive an invoice from [facility]. Please contact [facility] at [phone] with questions or concerns regarding your invoice.   IF you received labwork today, you will receive an invoice from [place]. Please contact [facility] at [phone] with questions or concerns regarding your invoice.   Our billing staff will not be able to assist you with questions regarding bills from these companies.  You will be contacted with the lab results as soon as they are available. The fastest way to get your results is to activate your My Chart account. Instructions are located on the last page of this paperwork. If you have not heard from [place] regarding the results in 2 weeks, please contact this office.      ?au ??u thng th???ng khng ro? nguyn nhn General Headache Without Cause ?au ??u l ?au hay kh ch?u ?? xung Holli Humbles ??u ho?c khu v??c c?. Nguyn nhn c? th? c?a ?au ??u c th? khng ???c pha?t hi?n. C nhi?u nguyn nhn v lo?i ?au ??u. M?t vi loa?i ?au ??u ph? bi?n la?:  ?au ??u c?ng th?ng.  ?au n?a ??u.  ?au ??u t??ng c?n.  ?au ??u hng ngy m?n tnh.  Tun th? nh?ng h??ng d?n ny ? nh:  Theo di tnh tr?ng c?a qu v? ?? pht hi?n b?t k? thay ??i no. Ti?n hnh nh?ng b???c sau ?? gip ca?i thi?n ti?nh tra?ng cu?a quy? vi?: X? tr ?au   Ch? s? d?ng thu?c khng k ??n v thu?c k ??n theo ch? d?n c?a chuyn gia ch?m [place] s?c kh?e.  N?m trong m?t phng t?i, yn t?nh khi qu v? b? ?au ??u.  N?u ???c ch? d?n, ch??m ? l?nh vo vng ??u v c?: ? Cho ? l?nh vo ti ni lng. ? ?? kh?n t?m ? gi?a da v ti ch??m. ? Ch??m ? l?nh trong 20 pht, 2-3 l?n m?i ngy.  S? d?ng ??m no?ng ho?c t?m n??c nng ?? t?ng nhi?t vo vng ??u v c? theo ch? d?n c?a chuyn gia ch?m [place] s?c kh?e.  Gi? cho nh sng d?u nh? n?u nh sng m?nh lm qu v? kh ch?u v  lm ch?ng ?au ??u t?i t? h?n. ?n v u?ng  ?n u?ng theo l?ch bnh th??ng.  H?n ch? u?ng r??u.  Gi?m l??ng cafein quy? vi? u?ng, ho?c ng?ng u?ng caffeine. H??ng d?n chung  Tun th? t?t c? cc l?n khm theo di theo ch? d?n c?a chuyn gia ch?m [place] s?c kh?e. ?i?u ny c vai tr quan tr?ng.  Ghi nh?t k ?au ??u hng ngy ?? gip tm ra ?i?u g c th? gy cc c?n ?au ??u. V d?, hy ghi l?i: ? Qu v? ?n v u?ng g. ? Qu v? ? ng? bao lu. ? B?t k? thay ??i no trong ch? ?? ?n ho?c thu?c men.  Th? k? thu?t xoa bp ho?c cc k? thu?t th? gin khc.  H?n ch? c?ng th?ng.  Ng?i th?ng l?ng v khng lm c?ng cc c?.  Khng s? d?ng cc s?n ph?m thu?c l no, bao g?m thu?c l d?ng ht, thu?c l d?ng nhai ho?c thu?c l ?i?n t?. N?u qu v? c?n gip ?? ?? cai thu?c, hy h?i chuyn gia ch?m [place] s?c kh?e.  T?p th? d?c th??ng xuyn theo ch? d?n c?a chuyn gia ch?m [place] s?c kh?e.  Ng? theo m?t l?ch trnh bi?nh  th??ng. Ng? 7-9 ti?ng, ho?c th?i gian ng? theo khuy?n ngh? c?a chuyn gia ch?m [place] s?c kh?e. Hy lin l?c v?i chuyn gia ch?m [place] s?c kh?e n?u:  Tri?u ch?ng c?a qu v? khng c?i thi?n ???c b?ng thu?c.  Quy? vi? b? ?au ??u khc v??i ?au ??u thng th??ng.  Qu v? b? bu?n nn ho?c qu v? nn.  Qu v? b? s?t. Yu c?u tr? gip ngay l?p t?c n?u:  Qu v? ?au ??u n?ng h?n.  Qu v? lin t?c b? nn m?a.  Qu v? b? c?ng c?.  Qu v? khng nhn th?y g.  Qu v? b? kh ni.  Qu v? b? ?au ? m?t ho?c tai.  Qu v? b? y?u c? ho?c m?t ki?m sot c?.  Qu v? m?t th?ng b?ng ho?c ?i l?i kh kh?n.  Qu v? c?m th?y mu?n ng?t ho?c ng?t.  Qu v? b? l l?n. Thng tin ny khng nh?m m?c ?ch thay th? cho l?i khuyn m chuyn gia ch?m [place] s?c kh?e ni v?i qu v?. Hy b?o ??m qu v? ph?i th?o lu?n b?t k? v?n ?? g m qu v? c v?i chuyn gia ch?m [place] s?c kh?e c?a qu v?. Document Released: 09/25/2015 Document Revised: 09/16/2016 Document Reviewed: 09/26/2014 Elsevier Interactive Patient Education  2018  [employer].

## 2017-08-26 NOTE — Assessment & Plan Note (Signed)
Stable headache.  Chronic nature.  No red flag signs or symptoms.  No need for imaging at this time.  Advised to take Tylenol only.  Has a history of chronic kidney disease and renal cancer status post nephrectomy.  Advised to avoid aspirin or NSAIDs.  Advised to return here if symptoms change or he gets worse.

## 2017-10-06 ENCOUNTER — Ambulatory Visit: Payer: BLUE CROSS/BLUE SHIELD | Admitting: Physician Assistant

## 2017-10-06 ENCOUNTER — Encounter: Payer: Self-pay | Admitting: Physician Assistant

## 2017-10-06 ENCOUNTER — Other Ambulatory Visit: Payer: Self-pay

## 2017-10-06 VITALS — BP 120/70 | HR 92 | Temp 98.2°F | Resp 16 | Ht 65.5 in | Wt 133.8 lb

## 2017-10-06 DIAGNOSIS — E785 Hyperlipidemia, unspecified: Secondary | ICD-10-CM | POA: Diagnosis not present

## 2017-10-06 DIAGNOSIS — B181 Chronic viral hepatitis B without delta-agent: Secondary | ICD-10-CM | POA: Diagnosis not present

## 2017-10-06 DIAGNOSIS — M7701 Medial epicondylitis, right elbow: Secondary | ICD-10-CM | POA: Diagnosis not present

## 2017-10-06 DIAGNOSIS — M25511 Pain in right shoulder: Secondary | ICD-10-CM

## 2017-10-06 DIAGNOSIS — G8929 Other chronic pain: Secondary | ICD-10-CM | POA: Insufficient documentation

## 2017-10-06 DIAGNOSIS — R42 Dizziness and giddiness: Secondary | ICD-10-CM | POA: Diagnosis not present

## 2017-10-06 DIAGNOSIS — H9313 Tinnitus, bilateral: Secondary | ICD-10-CM | POA: Diagnosis not present

## 2017-10-06 DIAGNOSIS — Z789 Other specified health status: Secondary | ICD-10-CM | POA: Insufficient documentation

## 2017-10-06 MED ORDER — AZELASTINE HCL 0.1 % NA SOLN: 2.0000 | Freq: Two times a day (BID) | NASAL | 0 refills | Status: AC

## 2017-10-06 NOTE — Assessment & Plan Note (Signed)
Lost to follow-up. Not addressed at this visit. Update CMET today. Will arrange follow-up with PCP, and recommend re-establishing with Dr. Ninetta LightsHatcher with ID.

## 2017-10-06 NOTE — Patient Instructions (Signed)
1. Try the nose spray. If it doesn't help, we will refer you to a specialist. 2. Get an ELBOW STRAP at your pharmacy. Wear it to help reduce the pain in the elbow. 3. The orthopedics office will call you to schedule the visit to evaluate the shoulder pain. I suspect that you need an injection.       IF you received an x-ray today, you will receive an invoice from [facility]. Please contact [facility] at [phone] with questions or concerns regarding your invoice.   IF you received labwork today, you will receive an [facility]. Please contact [facility] at [phone] with questions or concerns regarding your invoice.   Our billing staff will not be able to assist you with questions regarding bills from these companies.  You will be contacted with the lab results as soon as they are available. The fastest way to get your results is to activate your My Chart account. Instructions are located on the last page of this paperwork. If you have not heard from us regarding the results in 2 weeks, please contact this office.

## 2017-10-06 NOTE — Assessment & Plan Note (Signed)
Doubt injected TMs reflect persistent AOM. Possibly ETD, given morning congestion. Treatment with azelastine NS, but anticipate need for referral to ENT for additional evaluation and treatment.
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
## 2017-10-06 NOTE — Assessment & Plan Note (Signed)
Previously prescribed statin, but no longer taking this. Not addressed at today's visit.

## 2017-10-06 NOTE — Assessment & Plan Note (Addendum)
Likely tendonitis. Refer to orthopedics for additional evaluation and treatment. Anticipate need for imaging and injection. Hold off NSAIDS due to partial nephrectomy, though renal function has been normal.

## 2017-10-07 LAB — URINALYSIS, DIPSTICK ONLY
BILIRUBIN UA: NEGATIVE
Glucose, UA: NEGATIVE
KETONES UA: NEGATIVE
Leukocytes, UA: NEGATIVE
NITRITE UA: NEGATIVE
PH UA: 5 (ref 5.0–7.5)
Protein, UA: NEGATIVE
RBC UA: NEGATIVE
SPEC GRAV UA: 1.021 (ref 1.005–1.030)
UUROB: 0.2 mg/dL (ref 0.2–1.0)

## 2017-10-07 LAB — COMPREHENSIVE METABOLIC PANEL
ALK PHOS: 118 IU/L — AB (ref 39–117)
ALT: 166 IU/L — ABNORMAL HIGH (ref 0–44)
AST: 91 IU/L — AB (ref 0–40)
Albumin/Globulin Ratio: 1.5 (ref 1.2–2.2)
Albumin: 4.6 g/dL (ref 3.6–4.8)
BUN/Creatinine Ratio: 17 (ref 10–24)
BUN: 14 mg/dL (ref 8–27)
Bilirubin Total: <0.2 mg/dL (ref 0.0–1.2)
CALCIUM: 10 mg/dL (ref 8.6–10.2)
CO2: 25 mmol/L (ref 20–29)
CREATININE: 0.82 mg/dL (ref 0.76–1.27)
Chloride: 100 mmol/L (ref 96–106)
GFR calc Af Amer: 107 mL/min/{1.73_m2} (ref >59)
GFR, EST NON AFRICAN AMERICAN: 92 mL/min/{1.73_m2} (ref >59)
GLOBULIN, TOTAL: 3.1 g/dL (ref 1.5–4.5)
GLUCOSE: 93 mg/dL (ref 65–99)
Potassium: 5 mmol/L (ref 3.5–5.2)
SODIUM: 139 mmol/L (ref 134–144)
Total Protein: 7.7 g/dL (ref 6.0–8.5)

## 2017-10-07 LAB — CBC WITH DIFFERENTIAL/PLATELET
BASOS ABS: 0 10*3/uL (ref 0.0–0.2)
Basos: 1 %
EOS (ABSOLUTE): 0.3 10*3/uL (ref 0.0–0.4)
EOS: 4 %
HEMATOCRIT: 49 % (ref 37.5–51.0)
Hemoglobin: 16.4 g/dL (ref 13.0–17.7)
IMMATURE GRANULOCYTES: 0 %
Immature Grans (Abs): 0 10*3/uL (ref 0.0–0.1)
Lymphocytes Absolute: 3.1 10*3/uL (ref 0.7–3.1)
Lymphs: 43 %
MCH: 29.9 pg (ref 26.6–33.0)
MCHC: 33.5 g/dL (ref 31.5–35.7)
MCV: 89 fL (ref 79–97)
Monocytes Absolute: 0.7 10*3/uL (ref 0.1–0.9)
Monocytes: 11 %
NEUTROS PCT: 41 %
Neutrophils Absolute: 2.8 10*3/uL (ref 1.4–7.0)
Platelets: 263 10*3/uL (ref 150–379)
RBC: 5.49 x10E6/uL (ref 4.14–5.80)
RDW: 12.9 % (ref 12.3–15.4)
WBC: 7 10*3/uL (ref 3.4–10.8)

## 2017-10-07 LAB — URINALYSIS, MICROSCOPIC ONLY: Casts: NONE SEEN /lpf

## 2017-10-07 LAB — TSH: TSH: 1.62 u[IU]/mL (ref 0.450–4.500)

## 2017-10-24 ENCOUNTER — Ambulatory Visit (INDEPENDENT_AMBULATORY_CARE_PROVIDER_SITE_OTHER): Payer: BLUE CROSS/BLUE SHIELD | Admitting: Orthopaedic Surgery

## 2017-11-04 ENCOUNTER — Ambulatory Visit: Payer: BLUE CROSS/BLUE SHIELD | Admitting: Emergency Medicine

## 2017-12-26 ENCOUNTER — Ambulatory Visit: Payer: BLUE CROSS/BLUE SHIELD | Admitting: Emergency Medicine

## 2018-02-25 ENCOUNTER — Ambulatory Visit: Payer: BLUE CROSS/BLUE SHIELD | Admitting: Emergency Medicine

## 2018-02-25 ENCOUNTER — Other Ambulatory Visit: Payer: Self-pay

## 2018-02-25 ENCOUNTER — Encounter: Payer: Self-pay | Admitting: Emergency Medicine

## 2018-02-25 VITALS — BP 135/79 | HR 75 | Temp 98.8°F | Resp 16 | Ht 65.25 in | Wt 145.8 lb

## 2018-02-25 DIAGNOSIS — Z23 Encounter for immunization: Secondary | ICD-10-CM | POA: Diagnosis not present

## 2018-02-25 DIAGNOSIS — R748 Abnormal levels of other serum enzymes: Secondary | ICD-10-CM | POA: Diagnosis not present

## 2018-02-25 DIAGNOSIS — Z125 Encounter for screening for malignant neoplasm of prostate: Secondary | ICD-10-CM

## 2018-02-25 DIAGNOSIS — R4189 Other symptoms and signs involving cognitive functions and awareness: Secondary | ICD-10-CM | POA: Diagnosis not present

## 2018-02-25 NOTE — Progress Notes (Signed)
Tyler Carter 67 y.o.   Chief Complaint  Patient presents with  . Ear Pain    x left ear per patient it has been there since 1972 and labwork to check prostate    HISTORY OF PRESENT ILLNESS: This is a 67 y.o. male complaining of chronic left ear pain for years. Also requesting blood test for prostate.  Family member also complaining of intermittent cognitive dysfunction with memory loss.  No other significant symptoms. Has a history of elevated liver enzymes.  We will recheck that today.  HPI   Prior to Admission medications   Medication Sig Start Date End Date Taking? Authorizing Provider  azelastine (ASTELIN) 0.1 % nasal spray Place 2 sprays into both nostrils 2 (two) times daily. Use in each nostril as directed 10/06/17  Yes Jeffery, Chelle, PA  cetirizine (ZYRTEC) 10 MG tablet Take 10 mg by mouth daily.   Yes [provider]  Multiple Vitamins-Minerals (CENTRUM SILVER PO) Take by mouth.   Yes [provider]  Omega-3 Fatty Acids (FISH OIL) 1000 MG CAPS Take by mouth.   Yes [provider]    No Known Allergies  Patient Active Problem List   Diagnosis Date Noted  . Chronic hepatitis B (HCC) 10/06/2017  . Chronic right shoulder pain 10/06/2017  . Language barrier to communication 10/06/2017  . Tinnitus of both ears 10/06/2017  . Medial epicondylitis of right elbow 10/06/2017  . Chronic nonintractable headache 08/26/2017  . H/O partial nephrectomy 11/12/2016  . History of kidney cancer 11/12/2016  . History of gallstones 11/12/2016  . Erectile dysfunction 04/06/2015  . Tobacco use disorder 04/06/2015  . Hyperlipidemia 10/01/2014  . Hep B w/o coma 09/10/2014    Past Medical History:  Diagnosis Date  . Chronic kidney disease   . Hepatitis B   . Tobacco abuse     Past Surgical History:  Procedure Laterality Date  . KIDNEY SURGERY Right 06/07/2016   partial nephrectomy    Social History   Socioeconomic History  . Marital status: Married    Spouse name: Not on file  . Number of children: Not on file  . Years of education: Not on file  . Highest education level: Not on file  Occupational History  . Occupation: Public affairs consultant    CommentScience writer, [employer]  Social Needs  . Financial resource strain: Not on file  . Food insecurity:    Worry: Not on file    Inability: Not on file  . Transportation needs:    Medical: Not on file    Non-medical: Not on file  Tobacco Use  . Smoking status: Current Every Day Smoker    Packs/day: 0.50    Years: 20.00    Pack years: 10.00    Types: Cigarettes  . Smokeless tobacco: Never Used  . Tobacco comment: would like to quit states "can not"  Substance and Sexual Activity  . Alcohol use: No    Alcohol/week: 0.0 standard drinks  . Drug use: No  . Sexual activity: Not on file  Lifestyle  . Physical activity:    Days per week: Not on file    Minutes per session: Not on file  . Stress: Not on file  Relationships  . Social connections:    Talks on phone: Not on file    Gets together: Not on file    Attends religious service: Not on file    Active member of club or organization: Not on file    Attends meetings of  clubs or organizations: Not on file    Relationship status: Not on file  . Intimate partner violence:    Fear of current or ex partner: Not on file    Emotionally abused: Not on file    Physically abused: Not on file    Forced sexual activity: Not on file  Other Topics Concern  . Not on file  Social History Narrative   Originally from [place].   Came to the [place] in 2014.    Family History  Problem Relation Age of Onset  . Cancer Father        throat cancer     Review of Systems  Constitutional: Negative.  Negative for chills and fever.  HENT: Positive for ear pain (Chronic). Negative for hearing loss.   Eyes: Negative.  Negative for blurred vision and double vision.  Respiratory: Negative.  Negative for cough and shortness of breath.   Cardiovascular:  Negative.  Negative for chest pain and palpitations.  Gastrointestinal: Negative.  Negative for abdominal pain, diarrhea, nausea and vomiting.  Genitourinary: Negative.  Negative for dysuria and hematuria.  Musculoskeletal: Negative.  Negative for back pain, myalgias and neck pain.  Skin: Negative.  Negative for rash.  Neurological: Negative.  Negative for dizziness and headaches.  Endo/Heme/Allergies: Negative.     Vitals:   02/25/18 1537  BP: 135/79  Pulse: 75  Resp: 16  Temp: 98.8 F (37.1 C)  SpO2: 96%    Physical Exam  Constitutional: He is oriented to person, place, and time. He appears well-developed and well-nourished.  HENT:  Head: Normocephalic and atraumatic.  Right Ear: Tympanic membrane and ear canal normal.  Left Ear: Tympanic membrane and ear canal normal.  Eyes: Pupils are equal, round, and reactive to light. Conjunctivae are normal.  Neck: Normal range of motion. Neck supple.  Cardiovascular: Normal rate and regular rhythm.  Pulmonary/Chest: Effort normal and breath sounds normal.  Abdominal: Soft. There is no tenderness.  Musculoskeletal: Normal range of motion. He exhibits no edema or tenderness.  Neurological: He is alert and oriented to person, place, and time. No sensory deficit. He exhibits normal muscle tone.  Skin: Skin is warm and dry. Capillary refill takes less than 2 seconds.  Psychiatric: He has a normal mood and affect. His behavior is normal.  Vitals reviewed.   A total of 25 minutes was spent in the room with the patient, greater than 50% of which was in counseling/coordination of care regarding chronic medical problems, management, and need for follow-up.  ASSESSMENT & PLAN: Tyler Carter was seen today for ear pain.  Diagnoses and all orders for this visit:  Impairment of cognitive function -     Ambulatory referral to Neurology  Elevated liver enzymes -     Ambulatory referral to Gastroenterology -     Comprehensive metabolic  panel  Prostate cancer screening -     PSA  Need for prophylactic vaccination against Streptococcus pneumoniae (pneumococcus) -     Pneumococcal conjugate vaccine 13-valent IM    Patient Instructions       If you have lab work done today you will be contacted with your lab results within the next 2 weeks.  If you have not heard from [place] then please contact us. The fastest way to get your results is to register for My Chart.   IF you received an x-ray today, you will receive an invoice from [facility]. Please contact [facility] at [phone] with questions or concerns regarding your invoice.  IF you received labwork today, you will receive an invoice from [place]. Please contact [facility] at [phone] with questions or concerns regarding your invoice.   Our billing staff will not be able to assist you with questions regarding bills from these companies.  You will be contacted with the lab results as soon as they are available. The fastest way to get your results is to activate your My Chart account. Instructions are located on the last page of this paperwork. If you have not heard from [place] regarding the results in 2 weeks, please contact this office.     Health Maintenance, Male A healthy lifestyle and preventive care is important for your health and wellness. Ask your health care provider about what schedule of regular examinations is right for you. What should I know about weight and diet? Eat a Healthy Diet  Eat plenty of vegetables, fruits, whole grains, low-fat dairy products, and lean protein.  Do not eat a lot of foods high in solid fats, added sugars, or salt.  Maintain a Healthy Weight Regular exercise can help you achieve or maintain a healthy weight. You should:  Do at least 150 minutes of exercise each week. The exercise should increase your heart rate and make you sweat (moderate-intensity exercise).  Do strength-training exercises at  least twice a week.  Watch Your Levels of Cholesterol and Blood Lipids  Have your blood tested for lipids and cholesterol every 5 years starting at 66 years of age. If you are at high risk for heart disease, you should start having your blood tested when you are 66 years old. You may need to have your cholesterol levels checked more often if: ? Your lipid or cholesterol levels are high. ? You are older than 66 years of age. ? You are at high risk for heart disease.  What should I know about cancer screening? Many types of cancers can be detected early and may often be prevented. Lung Cancer  You should be screened every year for lung cancer if: ? You are a current smoker who has smoked for at least 30 years. ? You are a former smoker who has quit within the past 15 years.  Talk to your health care provider about your screening options, when you should start screening, and how often you should be screened.  Colorectal Cancer  Routine colorectal cancer screening usually begins at 66 years of age and should be repeated every 5-10 years until you are 66 years old. You may need to be screened more often if early forms of precancerous polyps or small growths are found. Your health care provider may recommend screening at an earlier age if you have risk factors for colon cancer.  Your health care provider may recommend using home test kits to check for hidden blood in the stool.  A small camera at the end of a tube can be used to examine your colon (sigmoidoscopy or colonoscopy). This checks for the earliest forms of colorectal cancer.  Prostate and Testicular Cancer  Depending on your age and overall health, your health care provider may do certain tests to screen for prostate and testicular cancer.  Talk to your health care provider about any symptoms or concerns you have about testicular or prostate cancer.  Skin Cancer  Check your skin from head to toe regularly.  Tell your health  care provider about any new moles or changes in moles, especially if: ? There is a change in a mole's size, shape, or  color. ? You have a mole that is larger than a pencil eraser.  Always use sunscreen. Apply sunscreen liberally and repeat throughout the day.  Protect yourself by wearing long sleeves, pants, a wide-brimmed hat, and sunglasses when outside.  What should I know about heart disease, diabetes, and high blood pressure?  If you are 47-81 years of age, have your blood pressure checked every 3-5 years. If you are 57 years of age or older, have your blood pressure checked every year. You should have your blood pressure measured twice-once when you are at a hospital or clinic, and once when you are not at a hospital or clinic. Record the average of the two measurements. To check your blood pressure when you are not at a hospital or clinic, you can use: ? An automated blood pressure machine at a pharmacy. ? A home blood pressure monitor.  Talk to your health care provider about your target blood pressure.  If you are between 22-27 years old, ask your health care provider if you should take aspirin to prevent heart disease.  Have regular diabetes screenings by checking your fasting blood sugar level. ? If you are at a normal weight and have a low risk for diabetes, have this test once every three years after the age of 81. ? If you are overweight and have a high risk for diabetes, consider being tested at a younger age or more often.  A one-time screening for abdominal aortic aneurysm (AAA) by ultrasound is recommended for men aged 65-75 years who are current or former smokers. What should I know about preventing infection? Hepatitis B If you have a higher risk for hepatitis B, you should be screened for this virus. Talk with your health care provider to find out if you are at risk for hepatitis B infection. Hepatitis C Blood testing is recommended for:  Everyone born from [birth year]  through 1964.  Anyone with known risk factors for hepatitis C.  Sexually Transmitted Diseases (STDs)  You should be screened each year for STDs including gonorrhea and chlamydia if: ? You are sexually active and are younger than 66 years of age. ? You are older than 66 years of age and your health care provider tells you that you are at risk for this type of infection. ? Your sexual activity has changed since you were last screened and you are at an increased risk for chlamydia or gonorrhea. Ask your health care provider if you are at risk.  Talk with your health care provider about whether you are at high risk of being infected with HIV. Your health care provider may recommend a prescription medicine to help prevent HIV infection.  What else can I do?  Schedule regular health, dental, and eye exams.  Stay current with your vaccines (immunizations).  Do not use any tobacco products, such as cigarettes, chewing tobacco, and e-cigarettes. If you need help quitting, ask your health care provider.  Limit alcohol intake to no more than 2 drinks per day. One drink equals 12 ounces of beer, 5 ounces of wine, or 1 ounces of hard liquor.  Do not use street drugs.  Do not share needles.  Ask your health care provider for help if you need support or information about quitting drugs.  Tell your health care provider if you often feel depressed.  Tell your health care provider if you have ever been abused or do not feel safe at home. This information is not intended to replace advice  given to you by your health care provider. Make sure you discuss any questions you have with your health care provider. Document Released: 11/30/2007 Document Revised: 01/31/2016 Document Reviewed: 03/07/2015 Elsevier Interactive Patient Education  2018 Elsevier Inc.      Edwina Barth, MD [facility]

## 2018-02-25 NOTE — Patient Instructions (Addendum)

## 2018-02-26 LAB — COMPREHENSIVE METABOLIC PANEL
A/G RATIO: 1.6 (ref 1.2–2.2)
ALBUMIN: 4.7 g/dL (ref 3.6–4.8)
ALK PHOS: 99 IU/L (ref 39–117)
ALT: 97 IU/L — ABNORMAL HIGH (ref 0–44)
AST: 47 IU/L — ABNORMAL HIGH (ref 0–40)
BILIRUBIN TOTAL: 0.3 mg/dL (ref 0.0–1.2)
BUN / CREAT RATIO: 16 (ref 10–24)
BUN: 12 mg/dL (ref 8–27)
CHLORIDE: 101 mmol/L (ref 96–106)
CO2: 25 mmol/L (ref 20–29)
Calcium: 9.6 mg/dL (ref 8.6–10.2)
Creatinine, Ser: 0.77 mg/dL (ref 0.76–1.27)
GFR calc non Af Amer: 95 mL/min/{1.73_m2} (ref >59)
GFR, EST AFRICAN AMERICAN: 109 mL/min/{1.73_m2} (ref >59)
GLOBULIN, TOTAL: 2.9 g/dL (ref 1.5–4.5)
Glucose: 117 mg/dL — ABNORMAL HIGH (ref 65–99)
Potassium: 4.8 mmol/L (ref 3.5–5.2)
SODIUM: 142 mmol/L (ref 134–144)
TOTAL PROTEIN: 7.6 g/dL (ref 6.0–8.5)

## 2018-02-26 LAB — PSA: Prostate Specific Ag, Serum: 2.2 ng/mL (ref 0.0–4.0)

## 2018-02-27 ENCOUNTER — Other Ambulatory Visit: Payer: Self-pay | Admitting: Emergency Medicine

## 2018-02-27 ENCOUNTER — Other Ambulatory Visit: Payer: Self-pay | Admitting: Family Medicine

## 2018-02-27 DIAGNOSIS — B354 Tinea corporis: Secondary | ICD-10-CM

## 2018-02-27 NOTE — Telephone Encounter (Signed)
Metronidazole 0.75% topical gel refill Last Refill:12/03/16 45 g 5 RF Last OV: 12/03/16 PCP: Dr Alvy BimlerSagardia Pharmacy:[facility] [address]

## 2018-02-27 NOTE — Telephone Encounter (Signed)
pravastatin refill Last Refill:start date 11/12/17; end date 08/26/17  Last OV: 10/06/17 PCP: Dr Alvy BimlerSagardia Pharmacy:[facility] [address]

## 2018-02-28 ENCOUNTER — Encounter: Payer: Self-pay | Admitting: *Deleted

## 2018-09-28 IMAGING — US US ABDOMEN COMPLETE
1 series · 13 of 25 positions shown · non-contrast
Comparison: None in PACs

CLINICAL DATA: Follow-up partial nephrectomy on the right for
malignancy. Known gallbladder disease, hepatitis-B.

EXAM:
ABDOMEN ULTRASOUND COMPLETE

[Series 1: us abdomen complete · 0.20mm/px · 13 of 108 slices shown]
[im 1/108]
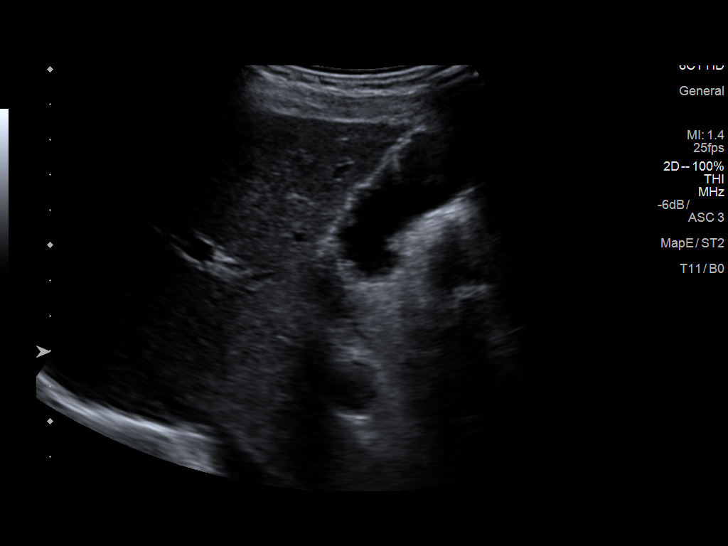
[im 9/108]
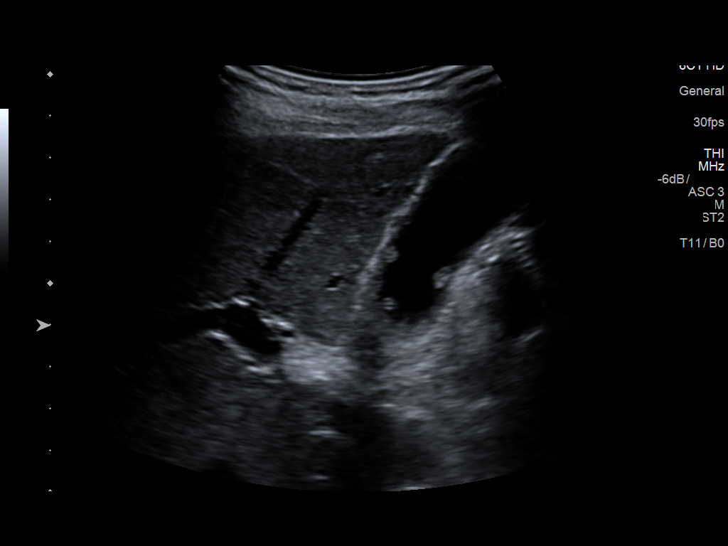
[im 18/108]
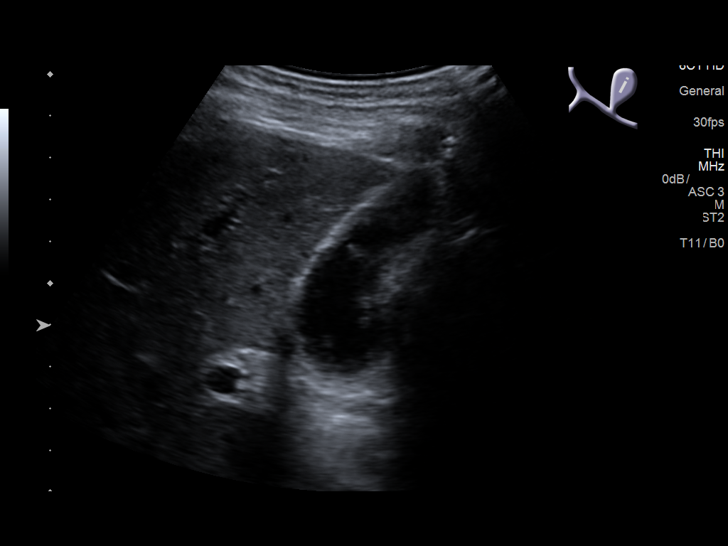
[im 27/108]
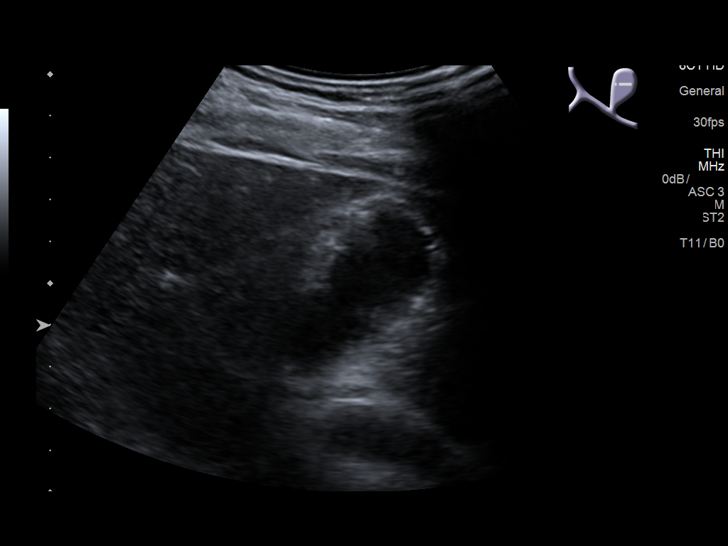
[im 36/108]
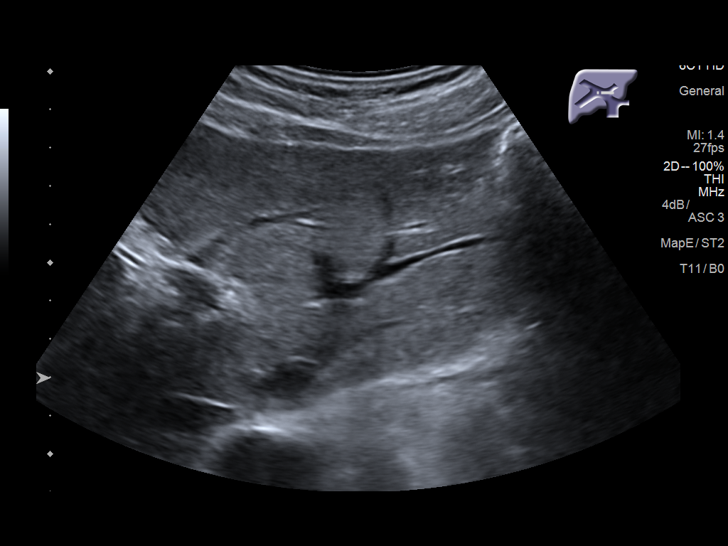
[im 45/108]
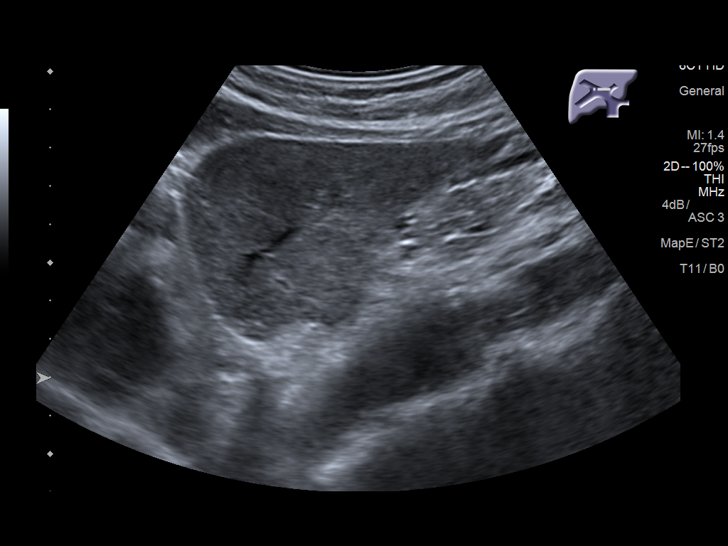
[im 54/108]
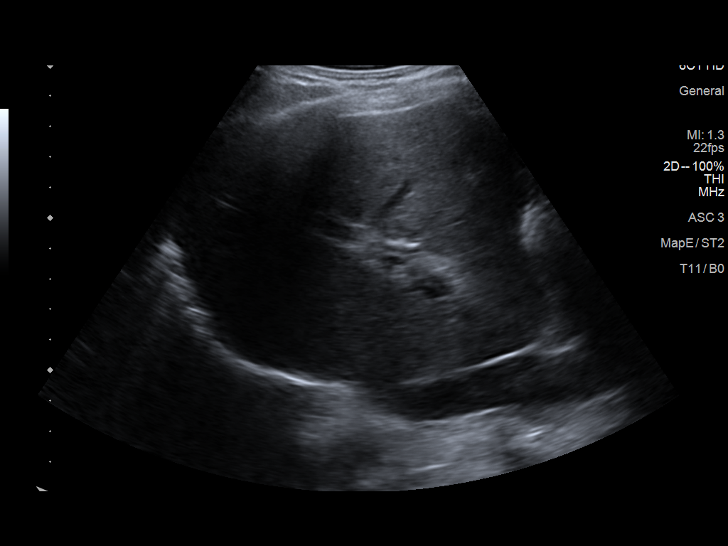
[im 63/108]
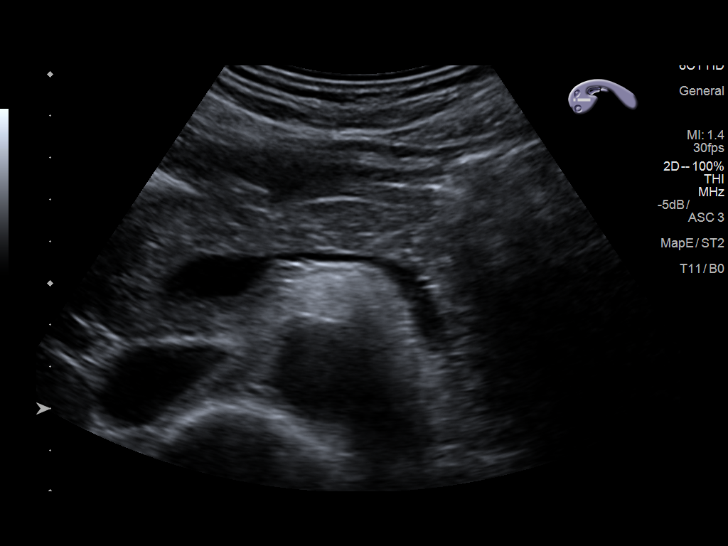
[im 72/108]
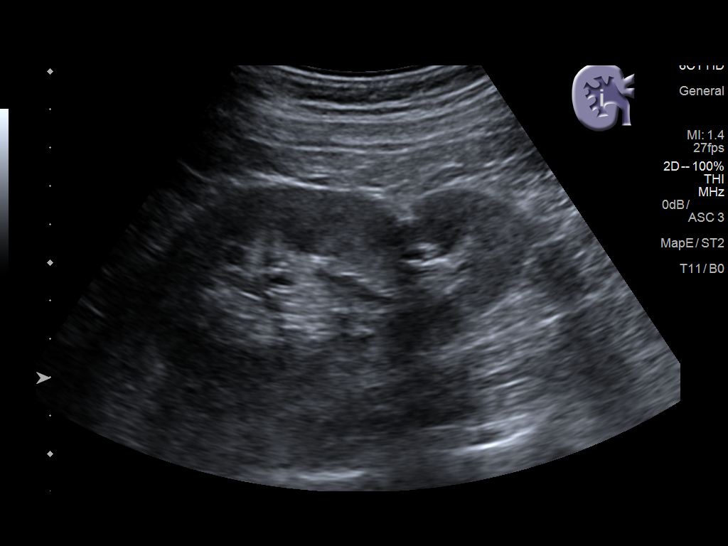
[im 81/108]
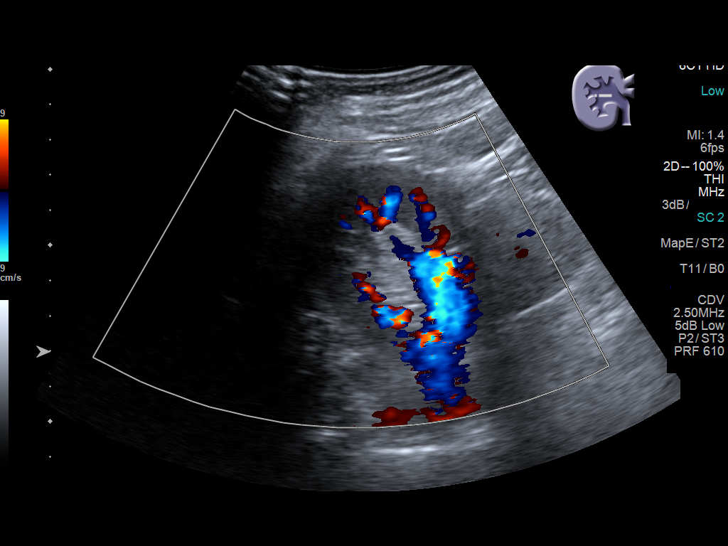
[im 90/108]
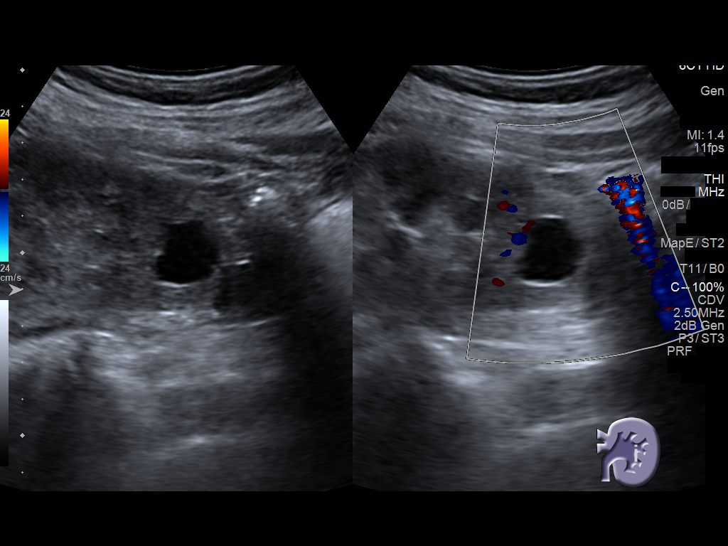
[im 99/108]
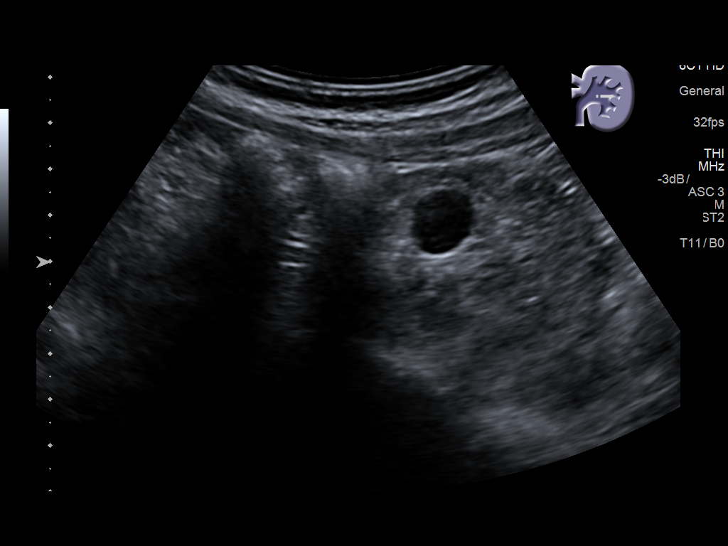
[im 108/108]
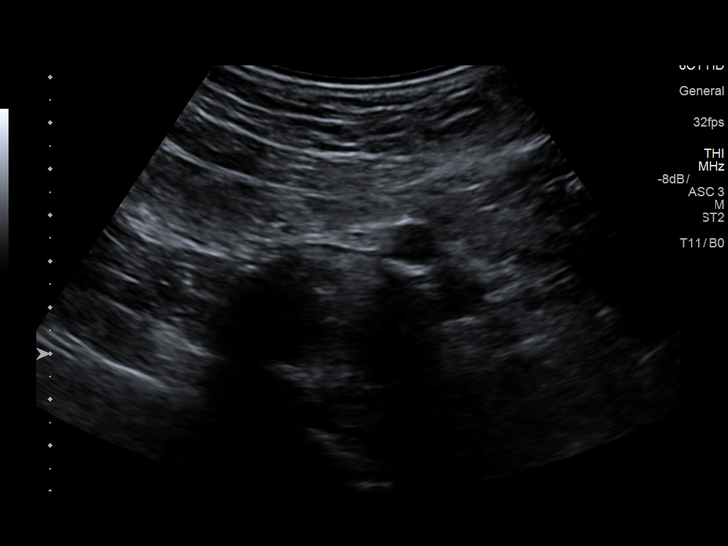

[13 of 25 positions shown; findings below may reference images not displayed]

FINDINGS: Gallbladder: The gallbladder is adequately distended. There are
echogenic non mobile foci with some ring down artifact noted. The
largest polyp measures 6 mm in diameter. No echogenic mobile
shadowing stones are observed. There is no gallbladder wall
thickening, pericholecystic fluid, or positive sonographic Murphy's
sign.

Common bile duct: Diameter: 3.9 mm

Liver: No focal lesion identified. Within normal limits in
parenchymal echogenicity. Portal vein is patent on color Doppler
imaging with normal direction of blood flow towards the liver.

IVC: No abnormality visualized.

Pancreas: Visualized portion unremarkable.

Spleen: Size and appearance within normal limits.

Right Kidney: Length: 10.9 cm. The renal cortical echotexture
remains lower than that of the adjacent liver. There appears to be
postsurgical change involving the mid to lower pole of the right
kidney. There is no hydronephrosis. No suspicious mass is observed.

Left Kidney: Length: 13.2 cm. The renal cortical echotexture is
similar to that on the right. There are 2 cysts present in the mid
to lower pole of the left kidney measuring up to 2.1 cm in greatest
dimension. No hydronephrosis.

Abdominal aorta: 2.8 cm in maximal dimension proximally. Normal
tapering of the caliber. No aneurysm.

Other findings: None.
IMPRESSION: Gallbladder polyps and mucosal changes that suggests
adenomyomatosis. No stones or sonographic evidence of acute
cholecystitis.

Postsurgical change in the mid to lower pole of the right kidney. No
suspicious right renal masses. Simple appearing cyst in the lower
pole of the left kidney.

## 2019-07-26 ENCOUNTER — Encounter (HOSPITAL_BASED_OUTPATIENT_CLINIC_OR_DEPARTMENT_OTHER): Payer: Self-pay | Admitting: Emergency Medicine

## 2019-07-26 ENCOUNTER — Other Ambulatory Visit: Payer: Self-pay

## 2019-07-26 ENCOUNTER — Emergency Department (HOSPITAL_BASED_OUTPATIENT_CLINIC_OR_DEPARTMENT_OTHER): Payer: 59

## 2019-07-26 ENCOUNTER — Inpatient Hospital Stay (HOSPITAL_BASED_OUTPATIENT_CLINIC_OR_DEPARTMENT_OTHER)
Admission: EM | Admit: 2019-07-26 | Discharge: 2019-07-29 | DRG: 177 | Disposition: A | Discharge status: Home Health | Payer: 59 | Attending: Family Medicine | Admitting: Family Medicine

## 2019-07-26 DIAGNOSIS — D72819 Decreased white blood cell count, unspecified: Secondary | ICD-10-CM | POA: Diagnosis present

## 2019-07-26 DIAGNOSIS — I10 Essential (primary) hypertension: Secondary | ICD-10-CM | POA: Diagnosis present

## 2019-07-26 DIAGNOSIS — E871 Hypo-osmolality and hyponatremia: Secondary | ICD-10-CM | POA: Diagnosis present

## 2019-07-26 DIAGNOSIS — R739 Hyperglycemia, unspecified: Secondary | ICD-10-CM | POA: Diagnosis not present

## 2019-07-26 DIAGNOSIS — Z905 Acquired absence of kidney: Secondary | ICD-10-CM | POA: Diagnosis not present

## 2019-07-26 DIAGNOSIS — Z23 Encounter for immunization: Secondary | ICD-10-CM

## 2019-07-26 DIAGNOSIS — T380X5A Adverse effect of glucocorticoids and synthetic analogues, initial encounter: Secondary | ICD-10-CM | POA: Diagnosis not present

## 2019-07-26 DIAGNOSIS — E860 Dehydration: Secondary | ICD-10-CM | POA: Diagnosis present

## 2019-07-26 DIAGNOSIS — B181 Chronic viral hepatitis B without delta-agent: Secondary | ICD-10-CM | POA: Diagnosis present

## 2019-07-26 DIAGNOSIS — Z808 Family history of malignant neoplasm of other organs or systems: Secondary | ICD-10-CM

## 2019-07-26 DIAGNOSIS — G3184 Mild cognitive impairment, so stated: Secondary | ICD-10-CM | POA: Diagnosis present

## 2019-07-26 DIAGNOSIS — R7881 Bacteremia: Secondary | ICD-10-CM | POA: Diagnosis not present

## 2019-07-26 DIAGNOSIS — U071 COVID-19: Principal | ICD-10-CM | POA: Diagnosis present

## 2019-07-26 DIAGNOSIS — J9601 Acute respiratory failure with hypoxia: Secondary | ICD-10-CM | POA: Diagnosis present

## 2019-07-26 DIAGNOSIS — Z79899 Other long term (current) drug therapy: Secondary | ICD-10-CM | POA: Diagnosis not present

## 2019-07-26 DIAGNOSIS — J1282 Pneumonia due to coronavirus disease 2019: Secondary | ICD-10-CM | POA: Diagnosis present

## 2019-07-26 DIAGNOSIS — Z87891 Personal history of nicotine dependence: Secondary | ICD-10-CM

## 2019-07-26 DIAGNOSIS — E785 Hyperlipidemia, unspecified: Secondary | ICD-10-CM | POA: Diagnosis present

## 2019-07-26 DIAGNOSIS — J189 Pneumonia, unspecified organism: Secondary | ICD-10-CM | POA: Insufficient documentation

## 2019-07-26 HISTORY — DX: Essential (primary) hypertension: I10

## 2019-07-26 HISTORY — DX: Pure hypercholesterolemia, unspecified: E78.00

## 2019-07-26 LAB — URINALYSIS, MICROSCOPIC (REFLEX)

## 2019-07-26 LAB — COMPREHENSIVE METABOLIC PANEL
ALT: 54 U/L — ABNORMAL HIGH (ref 0–44)
AST: 92 U/L — ABNORMAL HIGH (ref 15–41)
Albumin: 3.2 g/dL — ABNORMAL LOW (ref 3.5–5.0)
Alkaline Phosphatase: 68 U/L (ref 38–126)
Anion gap: 11 (ref 5–15)
BUN: 10 mg/dL (ref 8–23)
CO2: 23 mmol/L (ref 22–32)
Calcium: 8.4 mg/dL — ABNORMAL LOW (ref 8.9–10.3)
Chloride: 96 mmol/L — ABNORMAL LOW (ref 98–111)
Creatinine, Ser: 0.59 mg/dL — ABNORMAL LOW (ref 0.61–1.24)
GFR calc Af Amer: >60 mL/min (ref >60)
GFR calc non Af Amer: >60 mL/min (ref >60)
Glucose, Bld: 141 mg/dL — ABNORMAL HIGH (ref 70–99)
Potassium: 3.9 mmol/L (ref 3.5–5.1)
Sodium: 130 mmol/L — ABNORMAL LOW (ref 135–145)
Total Bilirubin: 0.5 mg/dL (ref 0.3–1.2)
Total Protein: 7 g/dL (ref 6.5–8.1)

## 2019-07-26 LAB — CBC WITH DIFFERENTIAL/PLATELET
Abs Immature Granulocytes: 0.02 10*3/uL (ref 0.00–0.07)
Basophils Absolute: 0 10*3/uL (ref 0.0–0.1)
Basophils Relative: 0 %
Eosinophils Absolute: 0 10*3/uL (ref 0.0–0.5)
Eosinophils Relative: 0 %
HCT: 44.4 % (ref 39.0–52.0)
Hemoglobin: 14.5 g/dL (ref 13.0–17.0)
Immature Granulocytes: 1 %
Lymphocytes Relative: 34 %
Lymphs Abs: 1.3 10*3/uL (ref 0.7–4.0)
MCH: 28.3 pg (ref 26.0–34.0)
MCHC: 32.7 g/dL (ref 30.0–36.0)
MCV: 86.7 fL (ref 80.0–100.0)
Monocytes Absolute: 0.5 10*3/uL (ref 0.1–1.0)
Monocytes Relative: 14 %
Neutro Abs: 2 10*3/uL (ref 1.7–7.7)
Neutrophils Relative %: 51 %
Platelets: 330 10*3/uL (ref 150–400)
RBC: 5.12 MIL/uL (ref 4.22–5.81)
RDW: 12.9 % (ref 11.5–15.5)
WBC: 3.9 10*3/uL — ABNORMAL LOW (ref 4.0–10.5)
nRBC: 0 % (ref 0.0–0.2)

## 2019-07-26 LAB — POCT I-STAT EG7
Acid-Base Excess: 1 mmol/L (ref 0.0–2.0)
Bicarbonate: 24.5 mmol/L (ref 20.0–28.0)
Calcium, Ion: 1.14 mmol/L — ABNORMAL LOW (ref 1.15–1.40)
HCT: 44 % (ref 39.0–52.0)
Hemoglobin: 15 g/dL (ref 13.0–17.0)
O2 Saturation: 88 %
Potassium: 3.9 mmol/L (ref 3.5–5.1)
Sodium: 131 mmol/L — ABNORMAL LOW (ref 135–145)
TCO2: 25 mmol/L (ref 22–32)
pCO2, Ven: 33.2 mmHg — ABNORMAL LOW (ref 44.0–60.0)
pH, Ven: 7.475 — ABNORMAL HIGH (ref 7.250–7.430)
pO2, Ven: 50 mmHg — ABNORMAL HIGH (ref 32.0–45.0)

## 2019-07-26 LAB — URINALYSIS, ROUTINE W REFLEX MICROSCOPIC
Bilirubin Urine: NEGATIVE
Glucose, UA: NEGATIVE mg/dL
Hgb urine dipstick: NEGATIVE
Ketones, ur: NEGATIVE mg/dL
Leukocytes,Ua: NEGATIVE
Nitrite: NEGATIVE
Protein, ur: 30 mg/dL — AB
Specific Gravity, Urine: 1.01 (ref 1.005–1.030)
pH: 7 (ref 5.0–8.0)

## 2019-07-26 LAB — FERRITIN: Ferritin: 1385 ng/mL — ABNORMAL HIGH (ref 24–336)

## 2019-07-26 LAB — LACTIC ACID, PLASMA: Lactic Acid, Venous: 1 mmol/L (ref 0.5–1.9)

## 2019-07-26 LAB — FIBRINOGEN: Fibrinogen: 564 mg/dL — ABNORMAL HIGH (ref 210–475)

## 2019-07-26 LAB — BRAIN NATRIURETIC PEPTIDE: B Natriuretic Peptide: 87.3 pg/mL (ref 0.0–100.0)

## 2019-07-26 LAB — SARS CORONAVIRUS 2 AG (30 MIN TAT): SARS Coronavirus 2 Ag: NEGATIVE

## 2019-07-26 LAB — CBG MONITORING, ED: Glucose-Capillary: 129 mg/dL — ABNORMAL HIGH (ref 70–99)

## 2019-07-26 LAB — D-DIMER, QUANTITATIVE: D-Dimer, Quant: 0.57 ug/mL-FEU — ABNORMAL HIGH (ref 0.00–0.50)

## 2019-07-26 LAB — C-REACTIVE PROTEIN: CRP: 4.6 mg/dL — ABNORMAL HIGH (ref <1.0)

## 2019-07-26 LAB — LACTATE DEHYDROGENASE: LDH: 446 U/L — ABNORMAL HIGH (ref 98–192)

## 2019-07-26 LAB — TRIGLYCERIDES: Triglycerides: 209 mg/dL — ABNORMAL HIGH (ref <150)

## 2019-07-26 LAB — PROCALCITONIN: Procalcitonin: 0.11 ng/mL

## 2019-07-26 LAB — RESPIRATORY PANEL BY RT PCR (FLU A&B, COVID)
Influenza A by PCR: NEGATIVE
Influenza B by PCR: NEGATIVE
SARS Coronavirus 2 by RT PCR: POSITIVE — AB

## 2019-07-26 LAB — MAGNESIUM: Magnesium: 2.3 mg/dL (ref 1.7–2.4)

## 2019-07-26 LAB — LIPASE, BLOOD: Lipase: 69 U/L — ABNORMAL HIGH (ref 11–51)

## 2019-07-26 LAB — TROPONIN I (HIGH SENSITIVITY): Troponin I (High Sensitivity): 12 ng/L (ref <18)

## 2019-07-26 LAB — PHOSPHORUS: Phosphorus: 3.1 mg/dL (ref 2.5–4.6)

## 2019-07-26 MED ORDER — AMLODIPINE BESYLATE 5 MG PO TABS
5.0000 mg | ORAL_TABLET | Freq: Every day | ORAL | Status: DC
  Administered 2019-07-26 – 2019-07-29 (×4): 5 mg via ORAL
  Filled 2019-07-26 (×4): qty 1

## 2019-07-26 MED ORDER — ACETAMINOPHEN 325 MG PO TABS: 650.0000 mg | ORAL_TABLET | Freq: Four times a day (QID) | ORAL | Status: DC | PRN

## 2019-07-26 MED ORDER — SODIUM CHLORIDE 0.9 % IV SOLN
100.0000 mg | Freq: Every day | INTRAVENOUS | Status: AC
  Administered 2019-07-26 – 2019-07-29 (×4): 100 mg via INTRAVENOUS
  Filled 2019-07-26 (×3): qty 20

## 2019-07-26 MED ORDER — SODIUM CHLORIDE 0.9 % IV SOLN
100.0000 mg | INTRAVENOUS | Status: AC
  Administered 2019-07-26: 13:00:00 100 mg via INTRAVENOUS
  Filled 2019-07-26 (×2): qty 20

## 2019-07-26 MED ORDER — FAMOTIDINE 20 MG PO TABS
20.0000 mg | ORAL_TABLET | Freq: Every day | ORAL | Status: DC
  Administered 2019-07-26 – 2019-07-29 (×4): 20 mg via ORAL
  Filled 2019-07-26 (×4): qty 1

## 2019-07-26 MED ORDER — ZINC SULFATE 220 (50 ZN) MG PO CAPS
220.0000 mg | ORAL_CAPSULE | Freq: Every day | ORAL | Status: DC
  Administered 2019-07-26 – 2019-07-29 (×4): 220 mg via ORAL
  Filled 2019-07-26 (×4): qty 1

## 2019-07-26 MED ORDER — ONDANSETRON HCL 4 MG/2ML IJ SOLN: 4.0000 mg | Freq: Four times a day (QID) | INTRAMUSCULAR | Status: DC | PRN

## 2019-07-26 MED ORDER — SODIUM CHLORIDE 0.9 % IV SOLN
1.0000 g | Freq: Once | INTRAVENOUS | Status: AC
  Administered 2019-07-26: 1 g via INTRAVENOUS
  Filled 2019-07-26: qty 10

## 2019-07-26 MED ORDER — GUAIFENESIN-DM 100-10 MG/5ML PO SYRP: 10.0000 mL | ORAL_SOLUTION | ORAL | Status: DC | PRN

## 2019-07-26 MED ORDER — HYDROCOD POLST-CPM POLST ER 10-8 MG/5ML PO SUER
5.0000 mL | Freq: Two times a day (BID) | ORAL | Status: DC | PRN
  Administered 2019-07-26: 5 mL via ORAL
  Filled 2019-07-26: qty 5

## 2019-07-26 MED ORDER — ASCORBIC ACID 500 MG PO TABS
500.0000 mg | ORAL_TABLET | Freq: Every day | ORAL | Status: DC
  Administered 2019-07-26 – 2019-07-29 (×4): 500 mg via ORAL
  Filled 2019-07-26 (×4): qty 1

## 2019-07-26 MED ORDER — ADULT MULTIVITAMIN W/MINERALS CH
1.0000 | ORAL_TABLET | Freq: Every day | ORAL | Status: DC
  Administered 2019-07-26 – 2019-07-29 (×4): 1 via ORAL
  Filled 2019-07-26 (×4): qty 1

## 2019-07-26 MED ORDER — ONDANSETRON HCL 4 MG PO TABS: 4.0000 mg | ORAL_TABLET | Freq: Four times a day (QID) | ORAL | Status: DC | PRN

## 2019-07-26 MED ORDER — SODIUM CHLORIDE 0.9 % IV SOLN: INTRAVENOUS | Status: DC

## 2019-07-26 MED ORDER — DEXAMETHASONE SODIUM PHOSPHATE 10 MG/ML IJ SOLN
6.0000 mg | INTRAMUSCULAR | Status: DC
  Administered 2019-07-26 – 2019-07-28 (×3): 6 mg via INTRAVENOUS
  Filled 2019-07-26 (×4): qty 1

## 2019-07-26 MED ORDER — POLYETHYLENE GLYCOL 3350 17 G PO PACK: 17.0000 g | PACK | Freq: Every day | ORAL | Status: DC | PRN

## 2019-07-26 MED ORDER — AZELASTINE HCL 0.1 % NA SOLN
2.0000 | Freq: Two times a day (BID) | NASAL | Status: DC
  Administered 2019-07-26 – 2019-07-29 (×6): 2 via NASAL
  Filled 2019-07-26: qty 30

## 2019-07-26 MED ORDER — AZITHROMYCIN 250 MG PO TABS
500.0000 mg | ORAL_TABLET | Freq: Once | ORAL | Status: AC
  Administered 2019-07-26: 500 mg via ORAL
  Filled 2019-07-26: qty 2

## 2019-07-26 MED ORDER — ENOXAPARIN SODIUM 40 MG/0.4ML ~~LOC~~ SOLN
40.0000 mg | SUBCUTANEOUS | Status: DC
  Administered 2019-07-26 – 2019-07-28 (×3): 40 mg via SUBCUTANEOUS
  Filled 2019-07-26 (×3): qty 0.4

## 2019-07-26 NOTE — Plan of Care (Signed)
  Problem: Education: Goal: Knowledge of risk factors and measures for prevention of condition will improve Outcome: Progressing   Problem: Coping: Goal: Psychosocial and spiritual needs will be supported Outcome: Progressing   Problem: Respiratory: Goal: Will maintain a patent airway Outcome: Progressing Goal: Complications related to the disease process, condition or treatment will be avoided or minimized Outcome: Progressing   

## 2019-07-26 NOTE — H&P (Addendum)
[facility] History and Physical  Tyler Carter EXB:[id number] DOB: [birth year] DOA: 07/26/2019   PCP: Georgina Quint, MD  Specialists: None  Chief Complaint: Shortness of breath  HPI: Tyler Carter is a 69 y.o. male of [place] who does not speak much English who lives with his wife.  Has a past medical history of essential hypertension, hyperlipidemia, chronic hepatitis B who is started developing shortness of breath on 07/13/2019 and his symptoms have progressively worsened.  Interpreter services were utilized.  A lot of history was also obtained from patient's son.  Patient is a poor historian even with the interpreter.  Patient denies any chest pain.  No abdominal pain currently.  No nausea vomiting.  He did have some diarrhea previously.  He has dry mouth.  He is feeling very thirsty.  He denies any cough but the nursing staff did note that the patient was coughing.  Patient denies any history of heart disease or lung disease.  He does have a history of chronic hepatitis B as mentioned above.  In the emergency department patient was noted to have hypoxia with oxygen saturations in the 80s.  He was placed on oxygen by nasal cannula with improvement to early 90s.  Chest x-ray shows basilar opacities concerning for pneumonia.  Patient was hospitalized for further management.  Home Medications: Prior to Admission medications   Medication Sig Start Date End Date Taking? Authorizing Provider  amLODipine (NORVASC) 5 MG tablet Take 5 mg by mouth daily. 05/01/19   [provider]  azelastine (ASTELIN) 0.1 % nasal spray Place 2 sprays into both nostrils 2 (two) times daily. Use in each nostril as directed 10/06/17   Porfirio Oar, PA  Multiple Vitamins-Minerals (CENTRUM SILVER PO) Take by mouth.    [provider]  Omega-3 Fatty Acids (FISH OIL) 1000 MG CAPS Take by mouth.    [provider]  rosuvastatin (CRESTOR) 10 MG tablet Take 10 mg by mouth daily. 05/01/19    [provider]    Allergies: No Known Allergies  Past Medical History: Past Medical History:  Diagnosis Date  . Chronic kidney disease   . Hepatitis B   . High cholesterol   . Hypertension   . Tobacco abuse     Past Surgical History:  Procedure Laterality Date  . KIDNEY SURGERY Right 06/07/2016   partial nephrectomy    Social History: Lives with his wife.  Former history of alcohol consumption.  Not currently.  Former history of smoking.  No illicit drug use.  He does not require any assistance in the house but does not walk much outside the house.  Family History:  Family History  Problem Relation Age of Onset  . Cancer Father        throat cancer     Review of Systems -color do due to language barrier as well as patient not really being cooperative with answering questions.  Physical Examination  Vitals:   07/26/19 1300 07/26/19 1330 07/26/19 1400 07/26/19 1531  BP: 120/77 133/79 131/79 (!) 141/92  Pulse: 84 91 87 93  Resp:  (!) 25 18 (!) 27  Temp:      TempSrc:    Oral  SpO2: 92% 93% 94% 92%  Weight:        BP (!) 141/92 (BP Location: Right Arm)   Pulse 93   Temp 98.5 F (36.9 C) (Oral)   Resp (!) 27   Wt 73.6 kg   SpO2 92%   BMI 26.79  kg/m   General appearance: alert, cooperative, appears stated age, distracted and no distress Head: Normocephalic, without obvious abnormality, atraumatic Eyes: conjunctivae/corneas clear. PERRL, EOM's intact.  Throat: Dry mucous membranes noted. Neck: no adenopathy, no carotid bruit, no JVD, supple, symmetrical, trachea midline and thyroid not enlarged, symmetric, no tenderness/mass/nodules Resp: Slightly tachypneic at rest.  Coarse breath sounds with crackles at the bases bilaterally.  No wheezing or rhonchi. Cardio: regular rate and rhythm, S1, S2 normal, no murmur, click, rub or gallop GI: soft, non-tender; bowel sounds normal; no masses,  no organomegaly Extremities: extremities normal, atraumatic, no  cyanosis or edema Pulses: 2+ and symmetric Skin: Skin color, texture, turgor normal. No rashes or lesions Lymph nodes: Cervical, supraclavicular, and axillary nodes normal. Neurologic: Alert.  Cranial nerves II through XII intact.  Motor strength equal bilateral upper and lower extremities.   Labs on Admission: I have personally reviewed following labs and imaging studies  CBC: Recent Labs  Lab 07/26/19 0913 07/26/19 0927  WBC 3.9*  --   NEUTROABS 2.0  --   HGB 14.5 15.0  HCT 44.4 44.0  MCV 86.7  --   PLT 330  --    Basic Metabolic Panel: Recent Labs  Lab 07/26/19 0913 07/26/19 0927 07/26/19 1040  NA 130* 131*  --   K 3.9 3.9  --   CL 96*  --   --   CO2 23  --   --   GLUCOSE 141*  --   --   BUN 10  --   --   CREATININE 0.59*  --   --   CALCIUM 8.4*  --   --   MG  --   --  2.3  PHOS  --   --  3.1   GFR: CrCl cannot be calculated (Unknown ideal weight.). Liver Function Tests: Recent Labs  Lab 07/26/19 0913  AST 92*  ALT 54*  ALKPHOS 68  BILITOT 0.5  PROT 7.0  ALBUMIN 3.2*   Recent Labs  Lab 07/26/19 0913  LIPASE 69*   CBG: Recent Labs  Lab 07/26/19 1425  GLUCAP 129*   Lipid Profile: Recent Labs    07/26/19 0913  TRIG 209*   Anemia Panel: Recent Labs    07/26/19 1040  FERRITIN 1,385*     Radiological Exams on Admission: DG Chest Port 1 View  Result Date: 07/26/2019 CLINICAL DATA:  Shortness of breath. EXAM: PORTABLE CHEST 1 VIEW COMPARISON:  September 10, 2014. FINDINGS: Mild cardiomegaly is noted. No pneumothorax or pleural effusion is noted. Bilateral airspace opacities are noted, right greater than left, concerning for multifocal pneumonia, potentially of viral etiology. Bony thorax is unremarkable. IMPRESSION: Interval development bilateral airspace opacities, right greater than left, concerning for multifocal pneumonia, potentially of viral etiology. Electronically Signed   By: Lupita Raider M.D.   On: 07/26/2019 09:52    My  interpretation of Electrocardiogram: Poor quality EKG with wavering baseline but appears to show sinus rhythm in the 90s.  Normal axis.  Intervals appear to be normal.  No concerning ST or T wave changes.   Problem List  Principal Problem:   Pneumonia due to COVID-19 virus Active Problems:   Chronic hepatitis B (HCC)   Acute respiratory failure with hypoxemia (HCC)   Hyponatremia   Essential hypertension   Assessment: This is a 69 year old [place] male with medical problems as listed earlier who presents with shortness of breath ongoing for more than a week.  Noted to be hypoxic in the  emergency department.  Chest x-ray shows bibasilar opacities right more than left.  He is tested positive for COVID-19.  He will be hospitalized for further management.  Plan:  Pneumonia due to COVID-19/acute respiratory failure with hypoxia Patient currently on 4 L saturating in the early 90s.  He has been started on remdesivir and steroids which will be continued.  His CRP is elevated but not significantly so.  Procalcitonin was not significantly elevated.  Hold off on antibacterials although he did get ceftriaxone and azithromycin in the emergency department.  Antitussive agents will be utilized.  Incentive spirometry mobilization as much as possible.  At this time we will hold off on offering Actemra or baricitinib.  FDA recently changed EUA for plasma so we are unable to offer convalescent plasma at this time.  PT and OT evaluation.  D-dimer only minimally elevated.  Ferritin is elevated at 1385.  CRP 4.6.  Essential hypertension Continue amlodipine.  Monitor blood pressures closely.  Hyponatremia/dehydration Patient does not appear to be dehydrated.  His mucous membranes are dry.  He will be given IV hydration for 24 hours.  Recheck labs tomorrow.  Mild transaminitis Most likely due to COVID-19.  Continue to monitor.  Leukopenia secondary to COVID-19 Monitor.  Hyperglycemia Check HbA1c in the  morning.  Anticipate some worsening in glucose levels due to steroids.  May need to initiate SSI.  History of chronic hepatitis B Abnormal LFTs most likely due to COVID-19.  Continue to monitor.  DVT Prophylaxis: Lovenox Code Status: Full code Family Communication: Discussed with the son. Disposition: Hopefully return home when improved Consults called: None Admission Status: Inpatient  Severity of Illness: The appropriate patient status for this patient is INPATIENT. Inpatient status is judged to be reasonable and necessary in order to provide the required intensity of service to ensure the patient's safety. The patient's presenting symptoms, physical exam findings, and initial radiographic and laboratory data in the context of their chronic comorbidities is felt to place them at high risk for further clinical deterioration. Furthermore, it is not anticipated that the patient will be medically stable for discharge from the hospital within 2 midnights of admission. The following factors support the patient status of inpatient.   " The patient's presenting symptoms include shortness of breath. " The worrisome physical exam findings include hypoxia. " The initial radiographic and laboratory data are worrisome because of bibasilar pneumonia and hyponatremia. " The chronic co-morbidities include hepatitis B, essential hypertension.   * I certify that at the point of admission it is my clinical judgment that the patient will require inpatient hospital care spanning beyond 2 midnights from the point of admission due to high intensity of service, high risk for further deterioration and high frequency of surveillance required.*  Further management decisions will depend on results of further testing and patient's response to treatment.  Nikira Kushnir [employer]  [facility] operational officer on [email or web address]  07/26/2019, 4:26 PM

## 2019-07-26 NOTE — Plan of Care (Signed)
  Problem: Education: Goal: Knowledge of risk factors and measures for prevention of condition will improve Outcome: Progressing   Problem: Education: Goal: Knowledge of risk factors and measures for prevention of condition will improve Outcome: Progressing   

## 2019-07-26 NOTE — ED Triage Notes (Signed)
Pt speaks [place] and no [place].  Son at bedside speaks some [place]. Interpreter video being used.  Pt started getting SOB on 1/26.  No other family members sick.  Last week started having trouble urinating.

## 2019-07-26 NOTE — ED Notes (Signed)
ED Provider at bedside. 

## 2019-07-26 NOTE — Progress Notes (Signed)
Admission, assessment and education completed with interpreter services. Pt educated and verbalized understanding of using the bed alarm and waiting for help before getting out of bed. He also verbalized and demonstrated how to use the I/S and flutter valve. No c/o chest pain. VSS 95% 5L [place]. Will continue to monitor. Bed in lowest position, call bell within reach.

## 2019-07-26 NOTE — ED Provider Notes (Signed)
[facility] EMERGENCY DEPARTMENT Provider Note   CSN: [id number] Arrival date & time: 07/26/19  0827     History Chief Complaint  Patient presents with  . Shortness of Breath    Tyler Carter is a 69 y.o. male.  69yo M w/ PMH including HTN, HLD, chronic hep B, CKD who p/w shortness of breath. Pt states he began feeling short of breath on 1/26 and symptoms have progressively worsened. He reports mild associated dull chest pain. He has had sore throat, 1 episode of vomiting, some diarrhea. No cough, fever, loss of taste/smell, sick contacts, recent travel. He denies any abdominal pain. Denies h/o heart or lung disease.   The history is provided by the patient. A language interpreter was used.  Shortness of Breath      Past Medical History:  Diagnosis Date  . Chronic kidney disease   . Hepatitis B   . High cholesterol   . Hypertension   . Tobacco abuse     Patient Active Problem List   Diagnosis Date Noted  . Chronic hepatitis B ([place]) 10/06/2017  . Chronic right shoulder pain 10/06/2017  . Language barrier to communication 10/06/2017  . Tinnitus of both ears 10/06/2017  . Medial epicondylitis of right elbow 10/06/2017  . Chronic nonintractable headache 08/26/2017  . H/O partial nephrectomy 11/12/2016  . History of kidney cancer 11/12/2016  . History of gallstones 11/12/2016  . Erectile dysfunction 04/06/2015  . Tobacco use disorder 04/06/2015  . Hyperlipidemia 10/01/2014  . Hep B w/o coma 09/10/2014    Past Surgical History:  Procedure Laterality Date  . KIDNEY SURGERY Right 06/07/2016   partial nephrectomy       Family History  Problem Relation Age of Onset  . Cancer Father        throat cancer    Social History   Tobacco Use  . Smoking status: Former Smoker    Years: 20.00    Types: Cigarettes    Quit date: 01/23/2019    Years since quitting: 0.5  . Smokeless tobacco: Never Used  . Tobacco comment: would like to quit states "can not"    Substance Use Topics  . Alcohol use: No    Alcohol/week: 0.0 standard drinks  . Drug use: No    Home Medications Prior to Admission medications   Medication Sig Start Date End Date Taking? Authorizing Provider  azelastine (ASTELIN) 0.1 % nasal spray Place 2 sprays into both nostrils 2 (two) times daily. Use in each nostril as directed 10/06/17   Harrison Mons, PA  cetirizine (ZYRTEC) 10 MG tablet Take 10 mg by mouth daily.    [provider]  Multiple Vitamins-Minerals (CENTRUM SILVER PO) Take by mouth.    [provider]  Omega-3 Fatty Acids (FISH OIL) 1000 MG CAPS Take by mouth.    [provider]    Allergies    Patient has no known allergies.  Review of Systems   Review of Systems  Respiratory: Positive for shortness of breath.    All other systems reviewed and are negative except that which was mentioned in HPI  Physical Exam Updated Vital Signs BP 139/81 (BP Location: Right Arm)   Pulse 100   Temp 98.5 F (36.9 C) (Oral)   Resp (!) 28   Wt 73.6 kg   SpO2 92% Comment: 3.5L [place]  BMI 26.79 kg/m   Physical Exam Vitals and nursing note reviewed.  Constitutional:      General: He is not in  acute distress.    Appearance: He is well-developed. He is ill-appearing. He is not toxic-appearing.  HENT:     Head: Normocephalic and atraumatic.  Eyes:     Conjunctiva/sclera: Conjunctivae normal.  Cardiovascular:     Rate and Rhythm: Normal rate and regular rhythm.     Heart sounds: Normal heart sounds. No murmur.  Pulmonary:     Effort: Tachypnea present. No respiratory distress.     Breath sounds: Decreased breath sounds present. No wheezing.     Comments: tachypneic w/ mildly increased WOB, diminished b/l Abdominal:     General: There is no distension.     Palpations: Abdomen is soft.     Tenderness: There is no abdominal tenderness.  Musculoskeletal:     Cervical back: Neck supple.     Right lower leg: No edema.     Left lower leg: No  edema.  Skin:    General: Skin is warm and dry.  Neurological:     Mental Status: He is alert and oriented to person, place, and time.     Comments: Fluent speech  Psychiatric:        Judgment: Judgment normal.     ED Results / Procedures / Treatments   Labs (all labs ordered are listed, but only abnormal results are displayed) Labs Reviewed  RESPIRATORY PANEL BY RT PCR (FLU A&B, COVID) - Abnormal; Notable for the following components:      Result Value   SARS Coronavirus 2 by RT PCR POSITIVE (*)    All other components within normal limits  COMPREHENSIVE METABOLIC PANEL - Abnormal; Notable for the following components:   Sodium 130 (*)    Chloride 96 (*)    Glucose, Bld 141 (*)    Creatinine, Ser 0.59 (*)    Calcium 8.4 (*)    Albumin 3.2 (*)    AST 92 (*)    ALT 54 (*)    All other components within normal limits  LIPASE, BLOOD - Abnormal; Notable for the following components:   Lipase 69 (*)    All other components within normal limits  CBC WITH DIFFERENTIAL/PLATELET - Abnormal; Notable for the following components:   WBC 3.9 (*)    All other components within normal limits  D-DIMER, QUANTITATIVE (NOT AT [facility]) - Abnormal; Notable for the following components:   D-Dimer, Quant 0.57 (*)    All other components within normal limits  LACTATE DEHYDROGENASE - Abnormal; Notable for the following components:   LDH 446 (*)    All other components within normal limits  FERRITIN - Abnormal; Notable for the following components:   Ferritin 1,385 (*)    All other components within normal limits  TRIGLYCERIDES - Abnormal; Notable for the following components:   Triglycerides 209 (*)    All other components within normal limits  FIBRINOGEN - Abnormal; Notable for the following components:   Fibrinogen 564 (*)    All other components within normal limits  C-REACTIVE PROTEIN - Abnormal; Notable for the following components:   CRP 4.6 (*)    All other components within normal  limits  URINALYSIS, ROUTINE W REFLEX MICROSCOPIC - Abnormal; Notable for the following components:   Protein, ur 30 (*)    All other components within normal limits  URINALYSIS, MICROSCOPIC (REFLEX) - Abnormal; Notable for the following components:   Bacteria, UA RARE (*)    All other components within normal limits  POCT I-STAT EG7 - Abnormal; Notable for the following components:  pH, Ven 7.475 (*)    pCO2, Ven 33.2 (*)    pO2, Ven 50.0 (*)    Sodium 131 (*)    Calcium, Ion 1.14 (*)    All other components within normal limits  SARS CORONAVIRUS 2 AG (30 MIN TAT)  CULTURE, BLOOD (ROUTINE X 2)  CULTURE, BLOOD (ROUTINE X 2)  URINE CULTURE  LACTIC ACID, PLASMA  BRAIN NATRIURETIC PEPTIDE  PROCALCITONIN  MAGNESIUM  PHOSPHORUS  I-STAT VENOUS BLOOD GAS, ED  TROPONIN I (HIGH SENSITIVITY)  TROPONIN I (HIGH SENSITIVITY)    EKG EKG Interpretation  Date/Time:  Monday July 26 2019 08:48:01 EST Ventricular Rate:  92 PR Interval:    QRS Duration: 99 QT Interval:  368 QTC Calculation: 456 R Axis:   -7 Text Interpretation: Sinus rhythm RSR' in V1 or V2, right VCD or RVH No previous ECGs available Confirmed by Frederick Peers [phone]) on 07/26/2019 9:00:23 AM   Radiology DG Chest Port 1 View  Result Date: 07/26/2019 CLINICAL DATA:  Shortness of breath. EXAM: PORTABLE CHEST 1 VIEW COMPARISON:  September 10, 2014. FINDINGS: Mild cardiomegaly is noted. No pneumothorax or pleural effusion is noted. Bilateral airspace opacities are noted, right greater than left, concerning for multifocal pneumonia, potentially of viral etiology. Bony thorax is unremarkable. IMPRESSION: Interval development bilateral airspace opacities, right greater than left, concerning for multifocal pneumonia, potentially of viral etiology. Electronically Signed   By: Lupita Raider M.D.   On: 07/26/2019 09:52    Procedures .Critical Care Performed by: Laurence Spates, MD Authorized by: Laurence Spates, MD    Critical care provider statement:    Critical care time (minutes):  30   Critical care time was exclusive of:  Separately billable procedures and treating other patients   Critical care was necessary to treat or prevent imminent or life-threatening deterioration of the following conditions:  Respiratory failure   Critical care was time spent personally by me on the following activities:  Development of treatment plan with patient or surrogate, evaluation of patient's response to treatment, examination of patient, obtaining history from patient or surrogate, ordering and performing treatments and interventions, ordering and review of laboratory studies, ordering and review of radiographic studies and re-evaluation of patient's condition   (including critical care time)  Medications Ordered in ED Medications  dexamethasone (DECADRON) injection 6 mg (6 mg Intravenous Given 07/26/19 1326)  remdesivir 100 mg in sodium chloride 0.9 % 100 mL IVPB (0 mg Intravenous Stopped 07/26/19 1357)  remdesivir 100 mg in sodium chloride 0.9 % 100 mL IVPB (100 mg Intravenous New Bag/Given 07/26/19 1416)  cefTRIAXone (ROCEPHIN) 1 g in sodium chloride 0.9 % 100 mL IVPB (0 g Intravenous Stopped 07/26/19 1137)  azithromycin (ZITHROMAX) tablet 500 mg (500 mg Oral Given 07/26/19 1108)    ED Course  I have reviewed the triage vital signs and the nursing notes.  Pertinent labs & imaging results that were available during my care of the patient were reviewed by me and considered in my medical decision making (see chart for details).    MDM Rules/Calculators/A&P                      Patient was hypoxic and placed on 4L [place], ~93% during my examination.  Tachypneic but no severe respiratory distress.  Diminished bilaterally.  Differential includes COVID-19, community-acquired pneumonia, volume overload with pulmonary edema.   Chest x-ray shows multifocal pneumonia.  Initially gave the patient community-acquired pneumonia  treatment with ceftriaxone  and azithromycin while awaiting other labs.  UA without infection, venous blood gas reassuring, sodium 130, very mild elevation of LFTs, lipase 69, troponin normal.  WBC 3.9.  Patient's COVID-19 test later resulted positive.  I have discussed with [facility] Dr. Robb Matar, who will facilitate admission at [facility].  Burak Sliwinski was evaluated in Emergency Department on 07/26/2019 for the symptoms described in the history of present illness. He was evaluated in the context of the global COVID-19 pandemic, which necessitated consideration that the patient might be at risk for infection with the SARS-CoV-2 virus that causes COVID-19. Institutional protocols and algorithms that pertain to the evaluation of patients at risk for COVID-19 are in a state of rapid change based on information released by regulatory bodies including the CDC and federal and state organizations. These policies and algorithms were followed during the patient's care in the ED.  Final Clinical Impression(s) / ED Diagnoses Final diagnoses:  None    Rx / DC Orders ED Discharge Orders    None       Hema Lanza, Ambrose [place], MD 07/26/19 1446

## 2019-07-27 DIAGNOSIS — R7881 Bacteremia: Secondary | ICD-10-CM

## 2019-07-27 LAB — COMPREHENSIVE METABOLIC PANEL
ALT: 50 U/L — ABNORMAL HIGH (ref 0–44)
AST: 72 U/L — ABNORMAL HIGH (ref 15–41)
Albumin: 3.1 g/dL — ABNORMAL LOW (ref 3.5–5.0)
Alkaline Phosphatase: 68 U/L (ref 38–126)
Anion gap: 11 (ref 5–15)
BUN: 15 mg/dL (ref 8–23)
CO2: 22 mmol/L (ref 22–32)
Calcium: 8.4 mg/dL — ABNORMAL LOW (ref 8.9–10.3)
Chloride: 102 mmol/L (ref 98–111)
Creatinine, Ser: 0.64 mg/dL (ref 0.61–1.24)
GFR calc Af Amer: >60 mL/min (ref >60)
GFR calc non Af Amer: >60 mL/min (ref >60)
Glucose, Bld: 124 mg/dL — ABNORMAL HIGH (ref 70–99)
Potassium: 4.5 mmol/L (ref 3.5–5.1)
Sodium: 135 mmol/L (ref 135–145)
Total Bilirubin: 0.6 mg/dL (ref 0.3–1.2)
Total Protein: 6.9 g/dL (ref 6.5–8.1)

## 2019-07-27 LAB — CBC WITH DIFFERENTIAL/PLATELET
Abs Immature Granulocytes: 0.02 10*3/uL (ref 0.00–0.07)
Basophils Absolute: 0 10*3/uL (ref 0.0–0.1)
Basophils Relative: 1 %
Eosinophils Absolute: 0 10*3/uL (ref 0.0–0.5)
Eosinophils Relative: 0 %
HCT: 43.2 % (ref 39.0–52.0)
Hemoglobin: 13.9 g/dL (ref 13.0–17.0)
Immature Granulocytes: 1 %
Lymphocytes Relative: 36 %
Lymphs Abs: 0.9 10*3/uL (ref 0.7–4.0)
MCH: 28.1 pg (ref 26.0–34.0)
MCHC: 32.2 g/dL (ref 30.0–36.0)
MCV: 87.4 fL (ref 80.0–100.0)
Monocytes Absolute: 0.3 10*3/uL (ref 0.1–1.0)
Monocytes Relative: 13 %
Neutro Abs: 1.2 10*3/uL — ABNORMAL LOW (ref 1.7–7.7)
Neutrophils Relative %: 49 %
Platelets: 428 10*3/uL — ABNORMAL HIGH (ref 150–400)
RBC: 4.94 MIL/uL (ref 4.22–5.81)
RDW: 13.1 % (ref 11.5–15.5)
WBC: 2.5 10*3/uL — ABNORMAL LOW (ref 4.0–10.5)
nRBC: 0 % (ref 0.0–0.2)

## 2019-07-27 LAB — FERRITIN: Ferritin: 1100 ng/mL — ABNORMAL HIGH (ref 24–336)

## 2019-07-27 LAB — ABO/RH: ABO/RH(D): O POS

## 2019-07-27 LAB — HIV ANTIBODY (ROUTINE TESTING W REFLEX): HIV Screen 4th Generation wRfx: NONREACTIVE

## 2019-07-27 LAB — URINE CULTURE: Culture: NO GROWTH

## 2019-07-27 LAB — D-DIMER, QUANTITATIVE: D-Dimer, Quant: 0.5 ug/mL-FEU (ref 0.00–0.50)

## 2019-07-27 LAB — C-REACTIVE PROTEIN: CRP: 2.8 mg/dL — ABNORMAL HIGH (ref <1.0)

## 2019-07-27 LAB — MAGNESIUM: Magnesium: 2.2 mg/dL (ref 1.7–2.4)

## 2019-07-27 NOTE — Evaluation (Signed)
Physical Therapy Evaluation Patient Details Name: Tyler Carter MRN: [id number] DOB: March 20, 1951 Today's Date: 07/27/2019   History of Present Illness  Tyler Carter is a 69 y.o. male of [place] origin who does not speak much English who lives with his wife.  Has a past medical history of essential hypertension, hyperlipidemia, chronic hepatitis B who is started developing shortness of breath on 07/13/2019 and his symptoms have progressively worsened.  Interpreter services were utilized.  A lot of history was also obtained from patient's son.  Patient is a poor historian even with the interpreter.  Patient denies any chest pain.  No abdominal pain currently.  No nausea vomiting.  He did have some diarrhea previously.  He has dry mouth.  He is feeling very thirsty.  He denies any cough but the nursing staff did note that the patient was coughing.  Patient denies any history of heart disease or lung disease.  He does have a history of chronic hepatitis B as mentioned above.  Clinical Impression   Pt admitted with above diagnosis. PTA was living home with family states was very independent. Denies having nor using any DME/AD. Pt currently with functional limitations due to the deficits listed below (see PT Problem List). This pm pt states he is fatigued but willing to attempt mobility, able to ambulate in room approx 91ft w/ no AD and min guard assist.  Pt will benefit from skilled PT to increase their independence and safety with mobility to allow discharge to the venue listed below. Pt has been educated, via interpreter on importance of use of incentive spirometer and also flutter valve.      Follow Up Recommendations Home health PT    Equipment Recommendations       Recommendations for Other Services       Precautions / Restrictions Precautions Precautions: Fall Restrictions Weight Bearing Restrictions: No      Mobility  Bed Mobility               General bed mobility comments: Pt found  sitting in recliner  Transfers Overall transfer level: Needs assistance Equipment used: None Transfers: Sit to/from Stand;Stand Pivot Transfers Sit to Stand: Supervision Stand pivot transfers: Supervision          Ambulation/Gait Ambulation/Gait assistance: Min guard Gait Distance (Feet): 46 Feet Assistive device: None Gait Pattern/deviations: Step-through pattern;Narrow base of support        Stairs            Wheelchair Mobility    Modified Rankin (Stroke Patients Only)       Balance Overall balance assessment: Needs assistance Sitting-balance support: Feet supported Sitting balance-Leahy Scale: Good     Standing balance support: During functional activity Standing balance-Leahy Scale: Fair                               Pertinent Vitals/Pain Pain Assessment: No/denies pain    Home Living Family/patient expects to be discharged to:: Private residence Living Arrangements: Spouse/significant other;Children Available Help at Discharge: Family Type of Home: House Home Access: Stairs to enter   Technical brewer of Steps: 2 Home Layout: One level Home Equipment: None      Prior Function Level of Independence: Independent               Hand Dominance   Dominant Hand: Left    Extremity/Trunk Assessment   Upper Extremity Assessment Upper Extremity Assessment: Defer to OT evaluation  Lower Extremity Assessment Lower Extremity Assessment: Overall WFL for tasks assessed    Cervical / Trunk Assessment Cervical / Trunk Assessment: Normal  Communication   Communication: Prefers language other than English(needs interpreter)  Cognition Arousal/Alertness: Lethargic Behavior During Therapy: WFL for tasks assessed/performed Overall Cognitive Status: No family/caregiver present to determine baseline cognitive functioning                                        General Comments      Exercises Other  Exercises Other Exercises: flutter valve x 10 Other Exercises: incentive spirometer x 10 pulls max   Assessment/Plan    PT Assessment Patient needs continued PT services  PT Problem List Decreased strength;Decreased activity tolerance;Decreased balance;Decreased mobility;Decreased coordination;Decreased knowledge of use of DME;Decreased safety awareness       PT Treatment Interventions Gait training;Stair training;Functional mobility training;Therapeutic activities;Therapeutic exercise;Balance training;Neuromuscular re-education;Cognitive remediation;Patient/family education    PT Goals (Current goals can be found in the Care Plan section)  Acute Rehab PT Goals Patient Stated Goal: to go home PT Goal Formulation: With patient Time For Goal Achievement: 08/10/19 Potential to Achieve Goals: Good    Frequency Min 3X/week   Barriers to discharge        Co-evaluation               AM-PAC PT "6 Clicks" Mobility  Outcome Measure Help needed turning from your back to your side while in a flat bed without using bedrails?: None Help needed moving from lying on your back to sitting on the side of a flat bed without using bedrails?: A Little Help needed moving to and from a bed to a chair (including a wheelchair)?: A Little Help needed standing up from a chair using your arms (e.g., wheelchair or bedside chair)?: A Little Help needed to walk in hospital room?: A Little Help needed climbing 3-5 steps with a railing? : A Lot 6 Click Score: 18    End of Session Equipment Utilized During Treatment: Oxygen Activity Tolerance: Patient limited by fatigue;Patient limited by lethargy;Treatment limited secondary to medical complications (Comment) Patient left: in chair;with call bell/phone within reach Nurse Communication: Mobility status PT Visit Diagnosis: Unsteadiness on feet (R26.81);Other abnormalities of gait and mobility (R26.89)    Time: 0240-9735 PT Time Calculation  (min) (ACUTE ONLY): 21 min   Charges:   PT Evaluation $PT Eval Moderate Complexity: 1 Mod PT Treatments $Therapeutic Activity: 8-22 mins        Drema Pry, PT   Freddi Starr 07/27/2019, 4:09 PM

## 2019-07-27 NOTE — Progress Notes (Addendum)
OCCUPATIONAL THERAPY EVALUATION  Clinical Impression:PTA Patient reports residing at home with family and performing self-care tasks and functional mobility with Independence. While sitting in recliner on 4L at rest, patient's O2 stats were at 93%.  Patient's O2 stats dropped to about 88% on 6L of O2 while mobilizing to bathroom with RW for toileting task at about 40 feet. Overall patient requires set-up to Minimal A for self-care task and Minimal assist during functional mobility. Patient educated on the importance of breathing exercises and use of flutter valver with return demo of 10 reps and incentive spirometer with average of .  Patient will benefit from continued services in the acute care setting to decrease burden of care upon d/c.   [employer] Interpretor: Cindie Crumbly #[id number]    07/27/19 0900  OT Visit Information  Assistance Needed +1  History of Present Illness Tyler Carter is a 69 y.o. male of [place] origin who does not speak much English who lives with his wife.  Has a past medical history of essential hypertension, hyperlipidemia, chronic hepatitis B who is started developing shortness of breath on 07/13/2019 and his symptoms have progressively worsened.  Interpreter services were utilized.  A lot of history was also obtained from patient's son.  Patient is a poor historian even with the interpreter.  Patient denies any chest pain.  No abdominal pain currently.  No nausea vomiting.  He did have some diarrhea previously.  He has dry mouth.  He is feeling very thirsty.  He denies any cough but the nursing staff did note that the patient was coughing.  Patient denies any history of heart disease or lung disease.  He does have a history of chronic hepatitis B as mentioned above.  Precautions  Precautions Fall  Required Braces or Orthoses  (Airborne)  Restrictions  Weight Bearing Restrictions No  Home Living  Family/patient expects to be discharged to: Private residence  Living Arrangements  Spouse/significant other;Children  Available Help at Discharge Family  Type of Home House  Home Access Stairs to enter  Entrance Stairs-Number of Steps 1  Home Layout One level  Bathroom Shower/Tub Tub/shower unit  Horticulturist, commercial Yes  How Accessible Accessible via walker  Home Equipment None  Prior Function  Level of Independence Independent  Communication  Communication No difficulties  Pain Assessment  Pain Assessment No/denies pain  Cognition  Arousal/Alertness Awake/alert  Behavior During Therapy WFL for tasks assessed/performed  Overall Cognitive Status Within Functional Limits for tasks assessed  Upper Extremity Assessment  Upper Extremity Assessment Generalized weakness;RUE deficits/detail;LUE deficits/detail  RUE  (Grossly 3+/5, WFL for AROM in all planes )  LUE  (Grossly 3+/5, WFL for AROM in all planes )  Lower Extremity Assessment  Lower Extremity Assessment Defer to PT evaluation  ADL  Overall ADL's  Needs assistance/impaired  Eating/Feeding Set up  Grooming Set up  Upper Body Bathing Set up  Lower Body Bathing Minimal assistance  Upper Body Dressing  Set up  Lower Body Dressing Minimal assistance  Toilet Transfer Minimal assistance  Toileting- Clothing Manipulation and Hygiene Minimal assistance  Functional mobility during ADLs Minimal assistance  Vision- History  Baseline Vision/History Wears glasses  Wears Glasses At all times  Bed Mobility  General bed mobility comments Patient sitting in recliner upon arrival for OT evaluation  Transfers  Overall transfer level Needs assistance  Equipment used Rolling walker (2 wheeled)  Transfers Sit to/from [employer] Transfers  Sit to [employer] guard  Stand pivot transfers Min  guard  Balance  Overall balance assessment Needs assistance  Sitting balance-Leahy Scale Good  Standing balance-Leahy Scale Fair  Exercises  Exercises Other exercises  Other Exercises  Other  Exercises flutter valve for 10 reps holding for about 3-4 seconds  Other Exercises IS for 6 reps with average of 756mL  OT - End of Session  Equipment Utilized During Treatment Gait belt;Rolling walker;Oxygen  Activity Tolerance Patient tolerated treatment well  Patient left in chair;with call bell/phone within reach  Nurse Communication Mobility status  OT Assessment  OT Recommendation/Assessment Patient needs continued OT Services  OT Visit Diagnosis Muscle weakness (generalized) (M62.81)  OT Problem List Decreased strength;Decreased activity tolerance;Impaired balance (sitting and/or standing);Decreased knowledge of use of DME or AE;Decreased knowledge of precautions;Cardiopulmonary status limiting activity  OT Plan  OT Frequency (ACUTE ONLY) Min 3X/week  OT Treatment/Interventions (ACUTE ONLY) Self-care/ADL training;Therapeutic exercise;Therapeutic activities;Patient/family education;Balance training;Energy conservation  AM-PAC OT "6 Clicks" Daily Activity Outcome Measure (Version 2)  Help from another person eating meals? 3  Help from another person taking care of personal grooming? 3  Help from another person toileting, which includes using toliet, bedpan, or urinal? 3  Help from another person bathing (including washing, rinsing, drying)? 3  Help from another person to put on and taking off regular upper body clothing? 3  Help from another person to put on and taking off regular lower body clothing? 3  6 Click Score 18  OT Recommendation  Follow Up Recommendations Home health OT  OT Equipment 3 in 1 bedside commode  Acute Rehab OT Goals  Patient Stated Goal to go home to family   OT Goal Formulation With patient  Time For Goal Achievement 08/10/19  Potential to Achieve Goals Good  OT Time Calculation  OT Start Time (ACUTE ONLY) 0917  OT Stop Time (ACUTE ONLY) 1018  OT Time Calculation (min) 61 min  OT General Charges  $OT Visit 1 Visit  OT Evaluation  $OT Eval Moderate  Complexity 1 Mod  OT Treatments  $Self Care/Home Management  23-37 mins  $Therapeutic Exercise 8-22 mins  Written Expression  Dominant Hand Left   Tyler Carter OTR/L

## 2019-07-27 NOTE — Progress Notes (Signed)
PROGRESS NOTE  Tyler Carter ESP:[id number] DOB: 02-18-1951 DOA: 07/26/2019  PCP: Horald Pollen, MD  Brief History/Interval Summary: 69 y.o. male of [place] origin who does not speak much English who lives with his wife.  Has a past medical history of essential hypertension, hyperlipidemia, chronic hepatitis B who is started developing shortness of breath on 07/13/2019 and his symptoms have progressively worsened.  Patient was noted to be hypoxic when evaluated in the emergency department.  He was subsequently hospitalized.  He was positive for COVID-19.  Chest x-ray showed findings concerning for pneumonia.  Reason for Visit: Pneumonia due to COVID-19.  Acute respiratory failure with hypoxia  Consultants: None  Procedures: None  Antibiotics: Anti-infectives (From admission, onward)   Start     Dose/Rate Route Frequency Ordered Stop   07/27/19 1000  remdesivir 100 mg in sodium chloride 0.9 % 100 mL IVPB     100 mg 200 mL/hr over 30 Minutes Intravenous Daily 07/26/19 1312 07/31/19 0959   07/26/19 1330  remdesivir 100 mg in sodium chloride 0.9 % 100 mL IVPB     100 mg 200 mL/hr over 30 Minutes Intravenous Every 30 min 07/26/19 1312 07/26/19 1429   07/26/19 1045  cefTRIAXone (ROCEPHIN) 1 g in sodium chloride 0.9 % 100 mL IVPB     1 g 200 mL/hr over 30 Minutes Intravenous  Once 07/26/19 1040 07/26/19 1137   07/26/19 1045  azithromycin (ZITHROMAX) tablet 500 mg     500 mg Oral  Once 07/26/19 1040 07/26/19 1108      Subjective/Interval History: Interpreter services not available.  Patient mentions that he is feeling slightly better.  He was told to do incentive spirometry.  He denies any pain.    Assessment/Plan:  Acute Hypoxic Resp. Failure/Pneumonia due to COVID-19  Recent Labs  Lab 07/26/19 0913 07/26/19 1040 07/27/19 0310  DDIMER 0.57*  --  0.50  FERRITIN  --  1,385* 1,100*  CRP  --  4.6* 2.8*  ALT 54*  --  50*  PROCALCITON 0.11  --   --     Objective  findings: Fever: He is afebrile Oxygen requirements: Nasal cannula 5 L/min.  Saturating in the early 90s.  COVID 19 Therapeutics: Antibacterials: Received in the ED, not continued Remdesivir: Day 2 Steroids: Dexamethasone Diuretics: None Actemra: Not given due to history of hepatitis B Convalescent Plasma: Not available at this time Vitamin C and Zinc: Continue PUD Prophylaxis: Pepcid DVT Prophylaxis:  Lovenox   Patient is stable from a respiratory standpoint.  He is on 5 L of oxygen.  He saturating in the early 90s.  Should be able to wean him down.  Inflammatory markers only minimally elevated with CRP improved to 2.8.  Patient being continued on remdesivir and steroids.  Incentive spirometry, mobilization, out of bed to chair.  Unable to offer convalescent plasma at this time due to change in FDA EUA.  No Actemra due to history of hepatitis B.  Hopefully patient will continue to improve.  Bacteremia Gram-positive cocci noted in 1 out of 4.  Likely a contaminant.  Patient is afebrile.  Hold off on adding antibiotics for now.  Wait on final education.  If patient starts developing fever then we may reconsider.  Essential hypertension Continue amlodipine.  Blood pressure is reasonably well controlled.  Hyponatremia/dehydration Patient was given IV fluids with improvement.  Cut back on the fluid rate.  Mild transaminitis Most likely due to COVID-19.  Patient with also history of chronic hepatitis B.  Stable.  Continue to monitor.  Leukopenia Due to COVID-19.  Hyperglycemia Appears to be mild.  Most likely due to steroids.  Continue to monitor daily.  History of chronic hepatitis B Continue to monitor.   DVT Prophylaxis: Lovenox Code Status: Full code Family Communication: We will update her son later today Disposition Plan: Management as outlined above   Medications:  Scheduled: . amLODipine  5 mg Oral Daily  . vitamin C  500 mg Oral Daily  . azelastine  2 spray Each  Nare BID  . dexamethasone (DECADRON) injection  6 mg Intravenous Q24H  . enoxaparin (LOVENOX) injection  40 mg Subcutaneous Q24H  . famotidine  20 mg Oral Daily  . multivitamin with minerals  1 tablet Oral Daily  . zinc sulfate  220 mg Oral Daily   Continuous: . sodium chloride 75 mL/hr at 07/27/19 0439  . remdesivir 100 mg in NS 100 mL 100 mg (07/27/19 0937)   GYF:VCBSWHQPRFFMB, chlorpheniramine-HYDROcodone, guaiFENesin-dextromethorphan, ondansetron **OR** ondansetron (ZOFRAN) IV, polyethylene glycol   Objective:  Vital Signs  Vitals:   07/26/19 1956 07/27/19 0000 07/27/19 0400 07/27/19 0749  BP: 129/77 123/77 131/90 127/81  Pulse: 90 81 83 96  Resp: 20 15 (!) 21 (!) 2  Temp: 97.9 F (36.6 C) (!) 97.2 F (36.2 C) (!) 97 F (36.1 C)   TempSrc: Oral Oral Oral Oral  SpO2: 91% 94% 93% 92%  Weight:        Intake/Output Summary (Last 24 hours) at 07/27/2019 1032 Last data filed at 07/27/2019 0400 Gross per 24 hour  Intake 1071.42 ml  Output 1100 ml  Net -28.58 ml   Filed Weights   07/26/19 0855 07/26/19 1531 07/26/19 1821  Weight: 73.6 kg 70.7 kg 70.7 kg    General appearance: Awake alert.  In no distress Resp: Tachypneic at rest.  Crackles bilateral bases.  No wheezing or rhonchi. Cardio: S1-S2 is normal regular.  No S3-S4.  No rubs murmurs or bruit GI: Abdomen is soft.  Nontender nondistended.  Bowel sounds are present normal.  No masses organomegaly Extremities: No edema.  Full range of motion of lower extremities. Neurologic: No focal neurological deficits.    Lab Results:  Data Reviewed: I have personally reviewed following labs and imaging studies  CBC: Recent Labs  Lab 07/26/19 0913 07/26/19 0927 07/27/19 0310  WBC 3.9*  --  2.5*  NEUTROABS 2.0  --  1.2*  HGB 14.5 15.0 13.9  HCT 44.4 44.0 43.2  MCV 86.7  --  87.4  PLT 330  --  428*    Basic Metabolic Panel: Recent Labs  Lab 07/26/19 0913 07/26/19 0927 07/26/19 1040 07/27/19 0310  NA 130* 131*   --  135  K 3.9 3.9  --  4.5  CL 96*  --   --  102  CO2 23  --   --  22  GLUCOSE 141*  --   --  124*  BUN 10  --   --  15  CREATININE 0.59*  --   --  0.64  CALCIUM 8.4*  --   --  8.4*  MG  --   --  2.3 2.2  PHOS  --   --  3.1  --     GFR: CrCl cannot be calculated (Unknown ideal weight.).  Liver Function Tests: Recent Labs  Lab 07/26/19 0913 07/27/19 0310  AST 92* 72*  ALT 54* 50*  ALKPHOS 68 68  BILITOT 0.5 0.6  PROT 7.0 6.9  ALBUMIN  3.2* 3.1*    Recent Labs  Lab 07/26/19 0913  LIPASE 69*    CBG: Recent Labs  Lab 07/26/19 1425  GLUCAP 129*    Lipid Profile: Recent Labs    07/26/19 0913  TRIG 209*    Anemia Panel: Recent Labs    07/26/19 1040 07/27/19 0310  FERRITIN 1,385* 1,100*    Recent Results (from the past 240 hour(s))  SARS Coronavirus 2 Ag (30 min TAT) - Nasal Swab (BD Veritor Kit)     Status: None   Collection Time: 07/26/19  9:13 AM   Specimen: Nasal Swab (BD Veritor Kit)  Result Value Ref Range Status   SARS Coronavirus 2 Ag NEGATIVE NEGATIVE Final    Comment: (NOTE) SARS-CoV-2 antigen NOT DETECTED.  Negative results are presumptive.  Negative results do not preclude SARS-CoV-2 infection and should not be used as the sole basis for treatment or other patient management decisions, including infection  control decisions, particularly in the presence of clinical signs and  symptoms consistent with COVID-19, or in those who have been in contact with the virus.  Negative results must be combined with clinical observations, patient history, and epidemiological information. The expected result is Negative. Fact Sheet for Patients: PodPark.tn Fact Sheet for Healthcare Providers: GiftContent.is This test is not yet approved or cleared by the [place] FDA and  has been authorized for detection and/or diagnosis of SARS-CoV-2 by FDA under an Emergency Use Authorization (EUA).   This EUA will remain in effect (meaning this test can be used) for the duration of  the COVID-19 de claration under Section 564(b)(1) of the Act, 21 U.S.C. section 360bbb-3(b)(1), unless the authorization is terminated or revoked sooner. Performed at [facility], [address] [place], [place] [address]   Respiratory Panel by RT PCR (Flu A&B, Covid) - Nasopharyngeal Swab     Status: Abnormal   Collection Time: 07/26/19 10:40 AM   Specimen: Nasopharyngeal Swab  Result Value Ref Range Status   SARS Coronavirus 2 by RT PCR POSITIVE (A) NEGATIVE Final    Comment: RESULT CALLED TO, READ BACK BY AND VERIFIED WITH: Macky Lower RN 12:55 07/26/19 ([email or web address]) (NOTE) SARS-CoV-2 target nucleic acids are DETECTED. SARS-CoV-2 RNA is generally detectable in upper respiratory specimens  during the acute phase of infection. Positive results are indicative of the presence of the identified virus, but do not rule out bacterial infection or co-infection with other pathogens not detected by the test. Clinical correlation with patient history and other diagnostic information is necessary to determine patient infection status. The expected result is Negative. Fact Sheet for Patients:  PinkCheek.be Fact Sheet for Healthcare Providers: GravelBags.it This test is not yet approved or cleared by the [place] FDA and  has been authorized for detection and/or diagnosis of SARS-CoV-2 by FDA under an Emergency Use Authorization (EUA).  This EUA will remain in effect (meaning this test can be used) fo r the duration of  the COVID-19 declaration under Section 564(b)(1) of the Act, 21 U.S.C. section 360bbb-3(b)(1), unless the authorization is terminated or revoked sooner.    Influenza A by PCR NEGATIVE NEGATIVE Final   Influenza B by PCR NEGATIVE NEGATIVE Final    Comment: (NOTE) The Xpert Xpress SARS-CoV-2/FLU/RSV assay is intended as an aid  in  the diagnosis of influenza from Nasopharyngeal swab specimens and  should not be used as a sole basis for treatment. Nasal washings and  aspirates are unacceptable for Xpert Xpress SARS-CoV-2/FLU/RSV  testing. Fact  Sheet for Patients: PinkCheek.be Fact Sheet for Healthcare Providers: GravelBags.it This test is not yet approved or cleared by the [place] FDA and  has been authorized for detection and/or diagnosis of SARS-CoV-2 by  FDA under an Emergency Use Authorization (EUA). This EUA will remain  in effect (meaning this test can be used) for the duration of the  Covid-19 declaration under Section 564(b)(1) of the Act, 21  U.S.C. section 360bbb-3(b)(1), unless the authorization is  terminated or revoked. Performed at [facility], [place] [address] [place], [place] [address]   Culture, blood (routine x 2)     Status: None (Preliminary result)   Collection Time: 07/26/19 11:00 AM   Specimen: BLOOD  Result Value Ref Range Status   Specimen Description   Final    BLOOD LEFT ARM Performed at [facility], [address] [place], [place] [address]    Special Requests   Final    BOTTLES DRAWN AEROBIC AND ANAEROBIC Blood Culture adequate volume Performed at [facility], [address] [place], [place] [address]    Culture   Final    NO GROWTH < 24 HOURS Performed at [facility], [place] [address], [place], [place] [address]    Report Status PENDING  Incomplete  Culture, blood (routine x 2)     Status: None (Preliminary result)   Collection Time: 07/26/19 11:10 AM   Specimen: BLOOD RIGHT HAND  Result Value Ref Range Status   Specimen Description   Final    BLOOD RIGHT HAND Performed at [facility], [address] [place], [place] [address]    Special Requests   Final    BOTTLES DRAWN AEROBIC AND ANAEROBIC Blood Culture adequate volume Performed at [facility], [address] [place], [place] [address]    Culture  Setup Time   Final    GRAM POSITIVE COCCI IN CHAINS ANAEROBIC BOTTLE ONLY CRITICAL RESULT CALLED TO, READ BACK BY AND VERIFIED WITH: S. CHRISTY PHARMD, AT 7169 2*9*21 BY Rush Landmark Performed at [facility], [place] [address] [place], [place] [address]    Culture [facility] POSITIVE COCCI  Final   Report Status PENDING  Incomplete      Radiology Studies: DG Chest Port 1 View  Result Date: 07/26/2019 CLINICAL DATA:  Shortness of breath. EXAM: PORTABLE CHEST 1 VIEW COMPARISON:  September 10, 2014. FINDINGS: Mild cardiomegaly is noted. No pneumothorax or pleural effusion is noted. Bilateral airspace opacities are noted, right greater than left, concerning for multifocal pneumonia, potentially of viral etiology. Bony thorax is unremarkable. IMPRESSION: Interval development bilateral airspace opacities, right greater than left, concerning for multifocal pneumonia, potentially of viral etiology. Electronically Signed   By: Marijo Conception M.D.   On: 07/26/2019 09:52       LOS: 1 day   [facility] Pager on [email or web address]  07/27/2019, 10:32 AM

## 2019-07-27 NOTE — Progress Notes (Signed)
PHARMACY - PHYSICIAN COMMUNICATION CRITICAL VALUE ALERT - BLOOD CULTURE IDENTIFICATION (BCID)  Tyler Carter is an 69 y.o. male who presented to [facility] on 07/26/2019 with a chief complaint of hypoxia, Covid +  Assessment: 2/8 Blood cx resulted 2/9: 1 of 4 bottles (anaerobic only) with GPC, consider contaminant  Name of physician (or Provider) Contacted: Dr Osvaldo Shipper, added to secure chat to notify  Current antibiotics: none Received Ceftriaxone & Azithromycin x1 on 2/8  Changes to prescribed antibiotics recommended: none  No results found for this or any previous visit.  Otho Bellows PharmD 07/27/2019  10:06 AM

## 2019-07-27 NOTE — Progress Notes (Signed)
Pt resting in chair. VSS no complaints of pain or signs of respiratory distress. Translation services was used to complete assessment and answer questions. Pain verbalized no pain or distress. He is eating lunch in chair with urinal, call bell I/S flutter valve within reach. Pt was educated on how to use the call bell and I/s , Flutter valve. Will continue to monitor.

## 2019-07-27 NOTE — Plan of Care (Signed)
  Problem: Education: Goal: Knowledge of risk factors and measures for prevention of condition will improve Outcome: Progressing   

## 2019-07-27 NOTE — Plan of Care (Signed)
  Problem: Education: Goal: Knowledge of risk factors and measures for prevention of condition will improve 07/27/2019 1620 by Janan Halter, RN Outcome: Progressing 07/27/2019 1225 by Janan Halter, RN Outcome: Progressing

## 2019-07-28 LAB — CBC WITH DIFFERENTIAL/PLATELET
Abs Immature Granulocytes: 0.04 10*3/uL (ref 0.00–0.07)
Basophils Absolute: 0 10*3/uL (ref 0.0–0.1)
Basophils Relative: 0 %
Eosinophils Absolute: 0 10*3/uL (ref 0.0–0.5)
Eosinophils Relative: 0 %
HCT: 43.3 % (ref 39.0–52.0)
Hemoglobin: 13.8 g/dL (ref 13.0–17.0)
Immature Granulocytes: 1 %
Lymphocytes Relative: 24 %
Lymphs Abs: 1.1 10*3/uL (ref 0.7–4.0)
MCH: 27.8 pg (ref 26.0–34.0)
MCHC: 31.9 g/dL (ref 30.0–36.0)
MCV: 87.3 fL (ref 80.0–100.0)
Monocytes Absolute: 0.6 10*3/uL (ref 0.1–1.0)
Monocytes Relative: 12 %
Neutro Abs: 2.8 10*3/uL (ref 1.7–7.7)
Neutrophils Relative %: 63 %
Platelets: 537 10*3/uL — ABNORMAL HIGH (ref 150–400)
RBC: 4.96 MIL/uL (ref 4.22–5.81)
RDW: 12.9 % (ref 11.5–15.5)
WBC: 4.6 10*3/uL (ref 4.0–10.5)
nRBC: 0 % (ref 0.0–0.2)

## 2019-07-28 LAB — COMPREHENSIVE METABOLIC PANEL
ALT: 49 U/L — ABNORMAL HIGH (ref 0–44)
AST: 59 U/L — ABNORMAL HIGH (ref 15–41)
Albumin: 3.2 g/dL — ABNORMAL LOW (ref 3.5–5.0)
Alkaline Phosphatase: 69 U/L (ref 38–126)
Anion gap: 9 (ref 5–15)
BUN: 19 mg/dL (ref 8–23)
CO2: 25 mmol/L (ref 22–32)
Calcium: 8.5 mg/dL — ABNORMAL LOW (ref 8.9–10.3)
Chloride: 105 mmol/L (ref 98–111)
Creatinine, Ser: 0.68 mg/dL (ref 0.61–1.24)
GFR calc Af Amer: >60 mL/min (ref >60)
GFR calc non Af Amer: >60 mL/min (ref >60)
Glucose, Bld: 142 mg/dL — ABNORMAL HIGH (ref 70–99)
Potassium: 4.7 mmol/L (ref 3.5–5.1)
Sodium: 139 mmol/L (ref 135–145)
Total Bilirubin: 0.5 mg/dL (ref 0.3–1.2)
Total Protein: 6.8 g/dL (ref 6.5–8.1)

## 2019-07-28 LAB — MAGNESIUM: Magnesium: 2.2 mg/dL (ref 1.7–2.4)

## 2019-07-28 LAB — HEMOGLOBIN A1C
Hgb A1c MFr Bld: 6.3 % — ABNORMAL HIGH (ref 4.8–5.6)
Mean Plasma Glucose: 134 mg/dL

## 2019-07-28 LAB — D-DIMER, QUANTITATIVE: D-Dimer, Quant: 0.35 ug/mL-FEU (ref 0.00–0.50)

## 2019-07-28 LAB — FERRITIN: Ferritin: 829 ng/mL — ABNORMAL HIGH (ref 24–336)

## 2019-07-28 LAB — C-REACTIVE PROTEIN: CRP: 0.8 mg/dL (ref <1.0)

## 2019-07-28 MED ORDER — DEXAMETHASONE 6 MG PO TABS
6.0000 mg | ORAL_TABLET | Freq: Every day | ORAL | Status: DC
  Administered 2019-07-29: 09:00:00 6 mg via ORAL
  Filled 2019-07-28: qty 1

## 2019-07-28 NOTE — Progress Notes (Signed)
OT Treatment Note:  Clinical Impression: Patient has demonstrated progress with cognitive orientation to situation, environment and d/c planning. Overall patient was able to complete self-care tasks with Modified Independence to Supervision Level and Supervision with functional mobility using rolling walker. Patient's O2 stats dropped to 92% during functional mobility on room air and during B UE therapeutic AROM exercises. Patient was given a visual demo of each exercises and able to provide return demo with Supervision.  Patient will benefit from continued skilled OT services at acute level.   [employer]atus Interpretor: Webb Silversmith #[id number]    07/28/19 1000  OT Visit Information  Assistance Needed +1  History of Present Illness Tyler Carter is a 69 y.o. male of [place] origin who does not speak much English who lives with his wife.  Has a past medical history of essential hypertension, hyperlipidemia, chronic hepatitis B who is started developing shortness of breath on 07/13/2019 and his symptoms have progressively worsened.  Interpreter services were utilized.  A lot of history was also obtained from patient's son.  Patient is a poor historian even with the interpreter.  Patient denies any chest pain.  No abdominal pain currently.  No nausea vomiting.  He did have some diarrhea previously.  He has dry mouth.  He is feeling very thirsty.  He denies any cough but the nursing staff did note that the patient was coughing.  Patient denies any history of heart disease or lung disease.  He does have a history of chronic hepatitis B as mentioned above.  Cognition  Arousal/Alertness Awake/alert  Behavior During Therapy WFL for tasks assessed/performed  Overall Cognitive Status Within Functional Limits for tasks assessed  ADL  Grooming Oral care;Wash/dry face;Wash/dry hands;Applying deodorant;Modified independent  Upper Body Bathing Set up  Lower Body Bathing Set up  Upper Body Dressing  Modified independent  Lower Body  Dressing Modified independent  Toilet Transfer Supervision/safety  Functional mobility during ADLs Supervision/safety;Rolling walker  General ADL Comments Patient mobilized around room with no AE, but was touching furniture to maintain dynamic balance. patient was administered the RW to assist with mobilization  Transfers  Overall transfer level Modified independent  Equipment used Rolling walker (2 wheeled)  Sit to Stand Modified independent (Device/Increase time)  Stand pivot transfers Modified independent (Device/Increase time)  Exercises  Exercises Other exercises;General Upper Extremity  General Exercises - Upper Extremity  Shoulder Flexion AROM  Shoulder Extension AROM  Shoulder Horizontal ABduction AROM  Shoulder Horizontal ADduction AROM  Elbow Flexion AROM  Elbow Extension AROM  Other Exercises  Other Exercises flutter valve x 10  Other Exercises incentive spirometer x 10 pulls max 77ml  OT - End of Session  Equipment Utilized During Treatment Rolling walker  Activity Tolerance Patient tolerated treatment well  Patient left in chair;with call bell/phone within reach  OT Assessment/Plan  Follow Up Recommendations Home health OT  OT Equipment 3 in 1 bedside commode (Rolling walker)  AM-PAC OT "6 Clicks" Daily Activity Outcome Measure (Version 2)  Help from another person eating meals? 4  Help from another person taking care of personal grooming? 4  Help from another person toileting, which includes using toliet, bedpan, or urinal? 4  Help from another person bathing (including washing, rinsing, drying)? 3  Help from another person to put on and taking off regular upper body clothing? 4  Help from another person to put on and taking off regular lower body clothing? 4  6 Click Score 23  OT Goal Progression  Progress towards OT goals  Progressing toward goals  Acute Rehab OT Goals  Patient Stated Goal to go home  OT Time Calculation  OT Start Time (ACUTE ONLY) 1035  OT  Stop Time (ACUTE ONLY) 1147  OT Time Calculation (min) 72 min  OT General Charges  $OT Visit 1 Visit  OT Treatments  $Self Care/Home Management  38-52 mins  $Therapeutic Activity 8-22 mins  $Therapeutic Exercise 8-22 mins

## 2019-07-28 NOTE — Plan of Care (Signed)
  Problem: Education: Goal: Knowledge of risk factors and measures for prevention of condition will improve Outcome: Progressing   

## 2019-07-28 NOTE — Progress Notes (Signed)
SATURATION QUALIFICATIONS: (This note is used to comply with regulatory documentation for home oxygen)  Patient Saturations on Room Air at Rest = 96%  Patient Saturations on Room Air while Ambulating = 92%  Patient Saturations on 2 Liters of oxygen while Ambulating = 96%  Please briefly explain why patient does not need home oxygen: Patient's respiration rate increased to 24 on room air, but O2 stats never dropped below 92% with ambulation of about 230 feet. Recommend patient obtain pulse oximeter to measure O2 stats in home setting.  Maryjean Corpening OTR/L

## 2019-07-28 NOTE — Progress Notes (Signed)
Hospitalist progress note   Patient from home, Patient going likely going home, Dispo discharge as early as a.m. 2/11  Tyler Carter [id number] DOB: 1950-12-20 DOA: 07/26/2019  PCP: Georgina Quint, MD   Narrative:  22 [place] male, HTN HLD chronic hep B, right nephrectomy 2018 mild cognitive impairment without prior hospital admission admitted on 2/8 with shortness of breath  Data Reviewed:  BUN/creatinine 15/0.6-->19/0.6 White count 2.5-->4.6 Hemoglobin 13 AST/ALT down from 72/50-->59/49  Platelet 537 A1c 6.3  COVID-19 Labs  Recent Labs    07/26/19 0913 07/26/19 1040 07/27/19 0310 07/28/19 0233  DDIMER 0.57*  --  0.50 0.35  FERRITIN  --  1,385* 1,100* 829*  LDH 446*  --   --   --   CRP  --  4.6* 2.8* 0.8    Lab Results  Component Value Date   SARSCOV2NAA POSITIVE (A) 07/26/2019     Assessment & Plan:  Coronavirus 19 pneumonia based on chest x-ray multifocal Initially required 5 L of oxygen--now not requiring any oxygen at all and ambulated 230 feet although required walker CRP improved Started on remdesivir 2/8, steroids-both to complete 2/12--if he remains stable we will try to get him to the clinic a.m. for last dose on 2/12 Chronic hepatitis B Relative contraindication for Actemra A1c 6.3 Needs careful consideration of use for hypoglycemic agents in the outpatient setting Nephrectomy in the past? Cognitive impairment   Called son [phone] and updated   Subjective: Fair OT found patient off oxygen, but he is stable without need at rest He walked to RR without apparent issue-see separate OT note re: O2 sats and ambulation He has many concernss about management at home He has no f,cp,cough   Consultants:   none  Objective: Vitals:   07/28/19 0300 07/28/19 0405 07/28/19 0410 07/28/19 0738  BP: (!) 150/94 126/77  120/74  Pulse: 87 82 76 71  Resp: 18 18 13 15   Temp: 97.8 F (36.6 C)   (!) 97.5 F (36.4 C)  TempSrc: Oral   Oral  SpO2: 94% 94%  94% 95%  Weight:        Intake/Output Summary (Last 24 hours) at 07/28/2019 0815 Last data filed at 07/28/2019 0400 Gross per 24 hour  Intake 100 ml  Output 900 ml  Net -800 ml   Filed Weights   07/26/19 0855 07/26/19 1531 07/26/19 1821  Weight: 73.6 kg 70.7 kg 70.7 kg    Examination: Awake alert in na dno focal deficit eomi ncat seborrhiec keratosis to face cta b abd not distended no guard Neuro intact slow gait  Scheduled Meds: . amLODipine  5 mg Oral Daily  . vitamin C  500 mg Oral Daily  . azelastine  2 spray Each Nare BID  . dexamethasone (DECADRON) injection  6 mg Intravenous Q24H  . enoxaparin (LOVENOX) injection  40 mg Subcutaneous Q24H  . famotidine  20 mg Oral Daily  . multivitamin with minerals  1 tablet Oral Daily  . zinc sulfate  220 mg Oral Daily   Continuous Infusions: . sodium chloride 30 mL/hr at 07/28/19 09/25/19  . remdesivir 100 mg in NS 100 mL 100 mg (07/27/19 0937)     LOS: 2 days   Time spent: 67 31, MD [facility]  07/28/2019, 8:15 AM

## 2019-07-28 NOTE — Progress Notes (Signed)
Physical Therapy Treatment Patient Details Name: Tyler Carter MRN: [id number] DOB: 1951/01/05 Today's Date: 07/28/2019    History of Present Illness Tyler Carter is a 69 y.o. male of [place] origin who does not speak much English who lives with his wife.  Has a past medical history of essential hypertension, hyperlipidemia, chronic hepatitis B who is started developing shortness of breath on 07/13/2019 and his symptoms have progressively worsened.  Interpreter services were utilized.  A lot of history was also obtained from patient's son.  Patient is a poor historian even with the interpreter.  Patient denies any chest pain.  No abdominal pain currently.  No nausea vomiting.  He did have some diarrhea previously.  He has dry mouth.  He is feeling very thirsty.  He denies any cough but the nursing staff did note that the patient was coughing.  Patient denies any history of heart disease or lung disease.  He does have a history of chronic hepatitis B as mentioned above.    PT Comments    Pt making great progress with mobility and tx. This pm pt was on room air and was able to complete all tasks with sat remaining in low to mid 90s. PT was at mod I with transfers and was able to ambulate approx 369ft with RW and  SBA.       Follow Up Recommendations  Home health PT     Equipment Recommendations  None recommended by PT    Recommendations for Other Services       Precautions / Restrictions Precautions Precautions: Fall Restrictions Weight Bearing Restrictions: No    Mobility  Bed Mobility               General bed mobility comments: pt sitting in recliner at therapist arrival  Transfers Overall transfer level: Modified independent Equipment used: None Transfers: Sit to/from [employer] Transfers Sit to Stand: Modified independent (Device/Increase time) Stand pivot transfers: Modified independent (Device/Increase time)          Ambulation/Gait Ambulation/Gait  assistance: Supervision Gait Distance (Feet): 300 Feet Assistive device: Rolling walker (2 wheeled) Gait Pattern/deviations: Step-through pattern Gait velocity: slight decrease   General Gait Details: 1x 325ft with RW and SBA, sats in 90s throughout on room air   Stairs             Wheelchair Mobility    Modified Rankin (Stroke Patients Only)       Balance Overall balance assessment: Mild deficits observed, not formally tested                                          Cognition Arousal/Alertness: Awake/alert Behavior During Therapy: WFL for tasks assessed/performed Overall Cognitive Status: Within Functional Limits for tasks assessed                                 General Comments: seems to be in functional range       Exercises      General Comments        Pertinent Vitals/Pain Pain Assessment: Faces Faces Pain Scale: No hurt    Home Living                      Prior Function            PT  Goals (current goals can now be found in the care plan section) Acute Rehab PT Goals Patient Stated Goal: to go home PT Goal Formulation: With patient Time For Goal Achievement: 08/10/19 Potential to Achieve Goals: Good Progress towards PT goals: Progressing toward goals    Frequency    Min 3X/week      PT Plan Discharge plan needs to be updated    Co-evaluation              AM-PAC PT "6 Clicks" Mobility   Outcome Measure  Help needed turning from your back to your side while in a flat bed without using bedrails?: None Help needed moving from lying on your back to sitting on the side of a flat bed without using bedrails?: None Help needed moving to and from a bed to a chair (including a wheelchair)?: A Little Help needed standing up from a chair using your arms (e.g., wheelchair or bedside chair)?: A Little Help needed to walk in hospital room?: A Little Help needed climbing 3-5 steps with a railing? :  A Lot 6 Click Score: 19    End of Session   Activity Tolerance: Patient limited by fatigue;Patient limited by lethargy Patient left: in chair;with call bell/phone within reach Nurse Communication: Mobility status PT Visit Diagnosis: Unsteadiness on feet (R26.81);Other abnormalities of gait and mobility (R26.89)     Time: 6789-3810 PT Time Calculation (min) (ACUTE ONLY): 16 min  Charges:  $Gait Training: 8-22 mins                    Drema Pry, PT    Freddi Starr 07/28/2019, 3:31 PM

## 2019-07-29 LAB — CBC WITH DIFFERENTIAL/PLATELET
Abs Immature Granulocytes: 0.03 10*3/uL (ref 0.00–0.07)
Basophils Absolute: 0 10*3/uL (ref 0.0–0.1)
Basophils Relative: 1 %
Eosinophils Absolute: 0 10*3/uL (ref 0.0–0.5)
Eosinophils Relative: 0 %
HCT: 44.3 % (ref 39.0–52.0)
Hemoglobin: 14.3 g/dL (ref 13.0–17.0)
Immature Granulocytes: 1 %
Lymphocytes Relative: 24 %
Lymphs Abs: 1.5 10*3/uL (ref 0.7–4.0)
MCH: 27.9 pg (ref 26.0–34.0)
MCHC: 32.3 g/dL (ref 30.0–36.0)
MCV: 86.5 fL (ref 80.0–100.0)
Monocytes Absolute: 0.8 10*3/uL (ref 0.1–1.0)
Monocytes Relative: 14 %
Neutro Abs: 3.7 10*3/uL (ref 1.7–7.7)
Neutrophils Relative %: 60 %
Platelets: 587 10*3/uL — ABNORMAL HIGH (ref 150–400)
RBC: 5.12 MIL/uL (ref 4.22–5.81)
RDW: 12.8 % (ref 11.5–15.5)
WBC: 6.1 10*3/uL (ref 4.0–10.5)
nRBC: 0 % (ref 0.0–0.2)

## 2019-07-29 LAB — COMPREHENSIVE METABOLIC PANEL
ALT: 52 U/L — ABNORMAL HIGH (ref 0–44)
AST: 48 U/L — ABNORMAL HIGH (ref 15–41)
Albumin: 3.3 g/dL — ABNORMAL LOW (ref 3.5–5.0)
Alkaline Phosphatase: 64 U/L (ref 38–126)
Anion gap: 10 (ref 5–15)
BUN: 19 mg/dL (ref 8–23)
CO2: 25 mmol/L (ref 22–32)
Calcium: 8.6 mg/dL — ABNORMAL LOW (ref 8.9–10.3)
Chloride: 102 mmol/L (ref 98–111)
Creatinine, Ser: 0.55 mg/dL — ABNORMAL LOW (ref 0.61–1.24)
GFR calc Af Amer: >60 mL/min (ref >60)
GFR calc non Af Amer: >60 mL/min (ref >60)
Glucose, Bld: 135 mg/dL — ABNORMAL HIGH (ref 70–99)
Potassium: 4.2 mmol/L (ref 3.5–5.1)
Sodium: 137 mmol/L (ref 135–145)
Total Bilirubin: 0.6 mg/dL (ref 0.3–1.2)
Total Protein: 6.9 g/dL (ref 6.5–8.1)

## 2019-07-29 LAB — CULTURE, BLOOD (ROUTINE X 2): Special Requests: ADEQUATE

## 2019-07-29 LAB — C-REACTIVE PROTEIN: CRP: 0.6 mg/dL (ref <1.0)

## 2019-07-29 LAB — MAGNESIUM: Magnesium: 2.2 mg/dL (ref 1.7–2.4)

## 2019-07-29 LAB — D-DIMER, QUANTITATIVE: D-Dimer, Quant: 0.33 ug/mL-FEU (ref 0.00–0.50)

## 2019-07-29 LAB — FERRITIN: Ferritin: 677 ng/mL — ABNORMAL HIGH (ref 24–336)

## 2019-07-29 MED ORDER — FAMOTIDINE 20 MG PO TABS: 20.0000 mg | ORAL_TABLET | Freq: Every day | ORAL | 0 refills | Status: AC

## 2019-07-29 MED ORDER — POLYETHYLENE GLYCOL 3350 17 G PO PACK: 17.0000 g | PACK | Freq: Every day | ORAL | 0 refills | Status: AC | PRN

## 2019-07-29 MED ORDER — DEXAMETHASONE 6 MG PO TABS: 6.0000 mg | ORAL_TABLET | Freq: Every day | ORAL | 0 refills | Status: AC

## 2019-07-29 MED ORDER — HYDROCOD POLST-CPM POLST ER 10-8 MG/5ML PO SUER: 5.0000 mL | Freq: Two times a day (BID) | ORAL | 0 refills | Status: AC | PRN

## 2019-07-29 NOTE — Progress Notes (Signed)
Patient scheduled for outpatient Remdesivir infusion at 10:00 AM on Friday 2/12.   Please advise them to report to [facility] at [address]  Drive to the security guard and tell them you are here for an infusion. They will direct you to the front entrance where we will come and get you.  For questions call [phone].  Thanks

## 2019-07-29 NOTE — Discharge Summary (Signed)
Physician Discharge Summary  Tyler Carter EUM:[id number] DOB: May 18, 1951 DOA: 07/26/2019  PCP: Horald Pollen, MD  Admit date: 07/26/2019 Discharge date: 07/29/2019  Time spent: 45 minutes  Recommendations for Outpatient Follow-up:  1. Will need outpatient completion of remdesivir on 2/12 as per below 2. Suggest completion of steroids as written 3. Have counseled family regarding discharge instructions and safety with Covid 4. Consider outpatient A1c and blood sugar management if continues to have elevated blood glucose  Discharge Diagnoses:  Principal Problem:   Pneumonia due to COVID-19 virus Active Problems:   Chronic hepatitis B ([place])   Acute respiratory failure with hypoxemia ([place])   Hyponatremia   Essential hypertension   Discharge Condition: Improved  Diet recommendation: Heart healthy  Filed Weights   07/26/19 0855 07/26/19 1531 07/26/19 1821  Weight: 73.6 kg 70.7 kg 70.7 kg    History of present illness:  72 [place] male, HTN HLD chronic hep B, right nephrectomy 2018 mild cognitive impairment without prior hospital admission admitted on 2/8 with shortness of breath   Hospital Course:  Coronavirus 19 pneumonia based on chest x-ray multifocal Initially required 5 L of oxygen--now not requiring any oxygen at all and ambulated 230 feet although required walker CRP improved Started on remdesivir 2/8, steroids-both to complete 2/12-and get last dose as per below on discharge have asked for home health Chronic hepatitis B Relative contraindication for Actemra A1c 6.3 Needs careful consideration of use for hypoglycemic agents in the outpatient setting Nephrectomy in the past? Cognitive impairment    Discharge Exam: Vitals:   07/29/19 0318 07/29/19 0722  BP:  138/84  Pulse: 76 95  Resp: 18 19  Temp:  (!) 97.5 F (36.4 C)  SpO2: 93% 94%    General: Awake alert coherent EOMI NCAT no focal deficit Cardiovascular: S1-S2 no murmur rub or  gallop Respiratory: Chest clear no added sound no rales no rhonchi Abdomen soft no rebound no guarding No lower extremity edema No focal neurological deficit  Discharge Instructions   Discharge Instructions    Diet - low sodium heart healthy   Complete by: As directed    Discharge instructions   Complete by: As directed    You will complete your therapy for coronavirus in the outpatient setting Remdesivir infusion at 10:00 AM on Friday 2/12.   Please advise them to report to [facility] at [address]  Drive to the security guard and tell them you are here for an infusion. They will direct you to the front entrance where we will come and get you.  For questions call [phone].  please make sure that you get the steroids from your pharmacy as this will help with the inflammation in your lungs please see below for important instructions regarding the coronavirus and precautions you must take  COVID 19 INSTRUCTIONS  - You are felt to be stable enough to no longer require inpatient monitoring, testing, and treatment, though you will need to follow the recommendations below: - Based on the CDC's non-test criteria for ending self-isolation: You may not return to work/leave the home until at least 21 days since symptom onset AND 3 days without a fever (without taking tylenol, ibuprofen, etc.) AND have improvement in respiratory symptoms. - Do not take NSAID medications (including, but not limited to, ibuprofen, advil, motrin, naproxen, aleve, goody's powder, etc.) - Follow up with your doctor in the next week via telehealth or seek medical attention right away if your symptoms get WORSE.  -  Consider donating plasma after you have recovered (either 14 days after a negative test or 28 days after symptoms have completely resolved) because your antibodies to this virus may be helpful to give to others with life-threatening infections. Please go to the website [email or web address] if  you would like to consider volunteering for plasma donation.   Directions for you at home:  Wear a facemask You should wear a facemask that covers your nose and mouth when you are in the same room with other people and when you visit a healthcare provider. People who live with or visit you should also wear a facemask while they are in the same room with you.  Separate yourself from other people in your home As much as possible, you should stay in a different room from other people in your home. Also, you should use a separate bathroom, if available.  Avoid sharing household items You should not share dishes, drinking glasses, cups, eating utensils, towels, bedding, or other items with other people in your home. After using these items, you should wash them thoroughly with soap and water.  Cover your coughs and sneezes Cover your mouth and nose with a tissue when you cough or sneeze, or you can cough or sneeze into your sleeve. Throw used tissues in a lined trash can, and immediately wash your hands with soap and water for at least 20 seconds or use an alcohol-based hand rub.  Wash your [facility] your hands often and thoroughly with soap and water for at least 20 seconds. You can use an alcohol-based hand sanitizer if soap and water are not available and if your hands are not visibly dirty. Avoid touching your eyes, nose, and mouth with unwashed hands.  Directions for those who live with, or provide care at home for you:  Limit the number of people who have contact with the patient If possible, have only one caregiver for the patient. Other household members should stay in another home or place of residence. If this is not possible, they should stay in another room, or be separated from the patient as much as possible. Use a separate bathroom, if available. Restrict visitors who do not have an essential need to be in the home.  Ensure good ventilation Make sure that  shared spaces in the home have good air flow, such as from an air conditioner or an opened window, weather permitting.  Wash your hands often Wash your hands often and thoroughly with soap and water for at least 20 seconds. You can use an alcohol based hand sanitizer if soap and water are not available and if your hands are not visibly dirty. Avoid touching your eyes, nose, and mouth with unwashed hands. Use disposable paper towels to dry your hands. If not available, use dedicated cloth towels and replace them when they become wet.  Wear a facemask and gloves Wear a disposable facemask at all times in the room and gloves when you touch or have contact with the patient's blood, body fluids, and/or secretions or excretions, such as sweat, saliva, sputum, nasal mucus, vomit, urine, or feces. Ensure the mask fits over your nose and mouth tightly, and do not touch it during use. Throw out disposable facemasks and gloves after using them. Do not reuse. Wash your hands immediately after removing your facemask and gloves. If your personal clothing becomes contaminated, carefully remove clothing and launder. Wash your hands after handling contaminated clothing. Place all used disposable facemasks, gloves, and other waste in  a lined container before disposing them with other household waste. Remove gloves and wash your hands immediately after handling these items.  Do not share dishes, glasses, or other household items with the patient Avoid sharing household items. You should not share dishes, drinking glasses, cups, eating utensils, towels, bedding, or other items with a patient who is confirmed to have, or being evaluated for, COVID-19 infection. After the person uses these items, you should wash them thoroughly with soap and water.  Wash laundry thoroughly Immediately remove and wash clothes or bedding that have blood, body fluids, and/or secretions or excretions, such as sweat, saliva, sputum,  nasal mucus, vomit, urine, or feces, on them. Wear gloves when handling laundry from the patient. Read and follow directions on labels of laundry or clothing items and detergent. In general, wash and dry with the warmest temperatures recommended on the label.  Clean all areas the individual has used often Clean all touchable surfaces, such as counters, tabletops, doorknobs, bathroom fixtures, toilets, phones, keyboards, tablets, and bedside tables, every day. Also, clean any surfaces that may have blood, body fluids, and/or secretions or excretions on them. Wear gloves when cleaning surfaces the patient has come in contact with. Use a diluted bleach solution (e.g., dilute bleach with 1 part bleach and 10 parts water) or a household disinfectant with a label that says EPA-registered for coronaviruses. To make a bleach solution at home, add 1 tablespoon of bleach to 1 quart (4 cups) of water. For a larger supply, add  cup of bleach to 1 gallon (16 cups) of water. Read labels of cleaning products and follow recommendations provided on product labels. Labels contain instructions for safe and effective use of the cleaning product including precautions you should take when applying the product, such as wearing gloves or eye protection and making sure you have good ventilation during use of the product. Remove gloves and wash hands immediately after cleaning.  Monitor yourself for signs and symptoms of illness Caregivers and household members are considered close contacts, should monitor their health, and will be asked to limit movement outside of the home to the extent possible. Follow the monitoring steps for close contacts listed on the symptom monitoring form.   If you have additional questions, contact your local health department or call the epidemiologist on call at [phone] (available 24/7). This guidance is subject to change. For the most up-to-date guidance from [facility], please refer to  their website: YouBlogs.pl   You were cared for by a hospitalist during your hospital stay. If you have any questions about your discharge medications or the care you received while you were in the hospital after you are discharged, you can call the unit and asked to speak with the hospitalist on call if the hospitalist that took care of you is not available. Once you are discharged, your primary care physician will handle any further medical issues. Please note that NO REFILLS for any discharge medications will be authorized once you are discharged, as it is imperative that you return to your primary care physician (or establish a relationship with a primary care physician if you do not have one) for your aftercare needs so that they can reassess your need for medications and monitor your lab values. If you do not have a primary care physician, you can call [phone] for a physician referral.   B?n s? hon thnh li?u php di?u tr? coronavirus trong mi tru?ng ngo?i tr - vui lng d?m b?o r?ng b?n mua steroid  t? nh thu?c v thu?c ny s? gip gi?m vim trong ph?i c?a b?n, vui lng xem bn du?i d? bi?t cc hu?ng d?n quan tr?ng v? coronavirus v cc bi?n php phng ng?a b?n ph?i th?c hi?n  HU?NG D?N COVID 19  - B?n c?m th?y d d? ?n d?nh d? khng c?n theo di, xt nghi?m v di?u tr? n?i tr n?a, m?c d b?n s? c?n tun theo cc khuy?n ngh? du?i dy: - D?a trn cc tiu ch khng xt nghi?m c?a CDC d? ch?m d?t tnh tr?ng t? c l?p: B?n khng du?c tr? l?i lm vi?c / r?i kh?i nh cho d?n t nh?t 21 ngy k? t? khi b?t d?u c tri?u ch?ng V 3 ngy khng b? s?t (khng dng tylenol, ibuprofen, v.v.) V c?i thi?n cc tri?u ch?ng h h?p. - Khng dng thu?c NSAID (bao g?m, nhung khng gi?i h?n, ibuprofen, advil, motrin, naproxen, aleve, goody's powder, v.v.) - Theo di v?i bc si c?a b?n trong tu?n t?i qua telehealth ho?c tm ki?m s? cham [place] y t? ngay  l?p t?c n?u cc tri?u ch?ng c?a b?n tr? nn TH?T S?. - Cn nh?c vi?c hi?n huy?t tuong sau khi b?n d kh?i b?nh (14 ngy sau khi xt nghi?m m tnh ho?c 28 ngy sau khi cc tri?u ch?ng d hon ton h?t) v cc khng th? c?a b?n ch?ng l?i lo?i vi rt ny c th? h?u ch d? truy?n cho nh?ng ngu?i khc b? nhi?m trng de d?a tnh m?ng. Vui lng truy c?p Medical laboratory [email or web address] n?u b?n mu?n xem xt tnh nguy?n hi?n huy?t tuong.  Ch? du?ng cho b?n t?i nh:  eo kh?u trang B?n nn deo kh?u trang che mui v mi?ng khi ? cng phng v?i ngu?i khc v khi b?n d?n tham m?t nh cung c?p d?ch v? cham [place] s?c kh?e. Nh?ng ngu?i s?ng cng ho?c d?n tham b?n cung nn deo kh?u trang khi h? ? cng phng v?i b?n.  Tch mnh kh?i nh?ng ngu?i khc trong nh c?a b?n Cng nhi?u cng t?t, b?n nn ? trong m?t can phng khc v?i nh?ng ngu?i khc trong nh c?a b?n. Ngoi ra, b?n nn s? d?ng m?t phng t?m, n?u c.  Hessie Diener dng chung d? gia d?ng B?n khng nn dng chung bt dia, ly u?ng nu?c, c?c, d?ng c? an u?ng, khan t?m, b? d? giu?ng ho?c cc v?t d?ng khc v?i nh?ng ngu?i khc trong nh c?a b?n. Sau khi s? d?ng cc v?t d?ng ny, b?n nn r?a th?t s?ch b?ng x phng v nu?c.  Che mi?ng khi ho v h?t hoi Che mi?ng v mui b?ng khan gi?y khi ho ho?c h?t hoi, ho?c b?n c th? ho ho?c h?t hoi vo ?ng tay o. Phi khan gi?y d qua s? d?ng cho vo thng rc c lt v ngay l?p t?c r?a tay b?ng x phng v nu?c trong t nh?t 20 giy ho?c s? d?ng c?n xoa tay.  R?a tay R?a tay thu?ng xuyn v k? lu?ng b?ng x phng v nu?c trong t nh?t 20 giy. B?n c th? s? d?ng bn tay c c?n ch?t kh? trng n?u x phng v nu?c khng c s?n v n?u tay b?n khng b? b?n. Hessie Diener ch?m vo m?t, mui v mi?ng v?i tay chua r?a.  Hu?ng d?n cho nh?ng ngu?i s?ng cng, ho?c cham [place] t?i nh cho b?n:  H?n ch? s? ngu?i ti?p xc v?i b?nh nhn N?u c th?, ch? c m?t ngu?i cham  cho b?nh nhn. Cc thnh vin  khc trong gia dnh nn ? nh ho?c noi cu tr  khc. N?u di?u ny l khng th?, h? nn ? l?i trong m?t phng khc, ho?c tch bi?t kh?i b?nh nhn cng nhi?u cng t?t. S? d?ng phng t?m ring, n?u c. H?n ch? khch d?n tham nh khng c nhu c?u thi?t y?u.  ?m b?o thng gi t?t ?m b?o r?ng cc khng gian chung trong nh c lu?ng khng kh t?t, ch?ng h?n nhu t? my di?u ha khng kh ho?c c?a s? m?, th?i ti?t cho php.  R?a tay thu?ng xuyn R?a tay thu?ng xuyn v k? lu?ng b?ng x phng v nu?c trong t nh?t 20 giy. B?n c th? s? d?ng ch?t kh? trng tay c c?n n?u khng c x phng v nu?c v n?u tay b?n khng qu b?n. Trnh ch?m vo m?t, mui v mi?ng c?a b?n b?ng tay chua r?a. Dng khan gi?y dng m?t l?n d? lau kh tay. N?u khng c s?n, hy s? d?ng khan v?i chuyn d?ng v thay chng khi chng b? u?t.  Mang kh?u trang v gang tay Lun deo kh?u trang dng m?t l?n trong phng v deo gang tay khi b?n ch?m vo ho?c ti?p xc v?i mu, d?ch co th? v / ho?c ch?t ti?t ho?c ch?t bi ti?t c?a b?nh nhn, ch?ng h?n nhu m? hi, nu?c b?t, d?m, ch?t nh?y mui, ch?t nn, nu?c ti?u ho?c phn . ?m b?o m?t n? v?a kht v?i mui v mi?ng c?a b?n, v khng ch?m vo trong khi s? d?ng. V?t kh?u trang v gang tay dng m?t l?n sau khi s? d?ng. Khng s? d?ng l?i. R?a tay ngay sau khi tho kh?u trang v gang tay. N?u qu?n o c nhn c?a b?n b? nhi?m b?n, hy c?n th?n c?i b? qu?n o v gi?t. R?a tay sau khi ti?p xc v?i qu?n o b? nhi?m b?n. Cho t?t c? kh?u trang, gang tay v cc ch?t th?i khc d qua s? d?ng vo thng c lt tru?c khi v?t chng cng v?i cc ch?t th?i sinh ho?t khc. Tho gang tay v r?a tay ngay sau khi ti?p xc v?i cc v?t d?ng ny.  Khng dng chung bt dia, ly ho?c cc d? gia d?ng khc v?i b?nh nhn Trnh dng chung d? gia d?ng. B?n khng nn dng chung bt dia, ly u?ng nu?c, c?c, d?ng c? an u?ng, khan t?m, khan tr?i giu?ng, ho?c cc v?t d?ng khc v?i b?nh nhn d du?c xc nh?n l c ho?c dang du?c dnh gi nhi?m COVID-19. Sau khi ngu?i d s? d?ng  cc v?t d?ng ny, b?n nn r?a k? b?ng x phng v nu?c.  Gi?t d? gi?t k? lu?ng H?i h?n ngay l?p t?c   Increase activity slowly   Complete by: As directed      Allergies as of 07/29/2019   No Known Allergies     Medication List    TAKE these medications   acetaminophen 500 MG tablet Commonly known as: TYLENOL Take 1,000 mg by mouth every 6 (six) hours as needed (for fever).   amLODipine 5 MG tablet Commonly known as: NORVASC Take 5 mg by mouth daily.   azelastine 0.1 % nasal spray Commonly known as: ASTELIN Place 2 sprays into both nostrils 2 (two) times daily. Use in each nostril as directed   chlorpheniramine-HYDROcodone 10-8 MG/5ML Suer Commonly known as: TUSSIONEX Take 5 mLs by mouth every 12 (twelve) hours as needed for cough.   dexamethasone 6 MG tablet Commonly known as: DECADRON  Take 1 tablet (6 mg total) by mouth daily.   EYE VITAMINS & MINERALS PO Take 1 tablet by mouth 2 (two) times daily.   famotidine 20 MG tablet Commonly known as: PEPCID Take 1 tablet (20 mg total) by mouth daily.   Fish Oil 1000 MG Caps Take 1,000 mg by mouth 2 (two) times daily.   Lubricant Eye Drops 0.4-0.3 % Soln Generic drug: Polyethyl Glycol-Propyl Glycol Place 1 drop into both eyes 3 (three) times daily as needed (dry/irritated eyes.).   polyethylene glycol 17 g packet Commonly known as: MIRALAX / GLYCOLAX Take 17 g by mouth daily as needed for mild constipation.   rosuvastatin 10 MG tablet Commonly known as: CRESTOR Take 10 mg by mouth daily.      No Known Allergies    The results of significant diagnostics from this hospitalization (including imaging, microbiology, ancillary and laboratory) are listed below for reference.    Significant Diagnostic Studies: DG Chest Port 1 View  Result Date: 07/26/2019 CLINICAL DATA:  Shortness of breath. EXAM: PORTABLE CHEST 1 VIEW COMPARISON:  September 10, 2014. FINDINGS: Mild cardiomegaly is noted. No pneumothorax or pleural  effusion is noted. Bilateral airspace opacities are noted, right greater than left, concerning for multifocal pneumonia, potentially of viral etiology. Bony thorax is unremarkable. IMPRESSION: Interval development bilateral airspace opacities, right greater than left, concerning for multifocal pneumonia, potentially of viral etiology. Electronically Signed   By: Marijo Conception M.D.   On: 07/26/2019 09:52    Microbiology: Recent Results (from the past 240 hour(s))  SARS Coronavirus 2 Ag (30 min TAT) - Nasal Swab (BD Veritor Kit)     Status: None   Collection Time: 07/26/19  9:13 AM   Specimen: Nasal Swab (BD Veritor Kit)  Result Value Ref Range Status   SARS Coronavirus 2 Ag NEGATIVE NEGATIVE Final    Comment: (NOTE) SARS-CoV-2 antigen NOT DETECTED.  Negative results are presumptive.  Negative results do not preclude SARS-CoV-2 infection and should not be used as the sole basis for treatment or other patient management decisions, including infection  control decisions, particularly in the presence of clinical signs and  symptoms consistent with COVID-19, or in those who have been in contact with the virus.  Negative results must be combined with clinical observations, patient history, and epidemiological information. The expected result is Negative. Fact Sheet for Patients: PodPark.tn Fact Sheet for Healthcare Providers: GiftContent.is This test is not yet approved or cleared by the [place] FDA and  has been authorized for detection and/or diagnosis of SARS-CoV-2 by FDA under an Emergency Use Authorization (EUA).  This EUA will remain in effect (meaning this test can be used) for the duration of  the COVID-19 de claration under Section 564(b)(1) of the Act, 21 U.S.C. section 360bbb-3(b)(1), unless the authorization is terminated or revoked sooner. Performed at [facility], [address] [place], [place]  [address]   Respiratory Panel by RT PCR (Flu A&B, Covid) - Nasopharyngeal Swab     Status: Abnormal   Collection Time: 07/26/19 10:40 AM   Specimen: Nasopharyngeal Swab  Result Value Ref Range Status   SARS Coronavirus 2 by RT PCR POSITIVE (A) NEGATIVE Final    Comment: RESULT CALLED TO, READ BACK BY AND VERIFIED WITH: Macky Lower RN 12:55 07/26/19 ([email or web address]) (NOTE) SARS-CoV-2 target nucleic acids are DETECTED. SARS-CoV-2 RNA is generally detectable in upper respiratory specimens  during the acute phase of infection. Positive results are indicative of the presence of  the identified virus, but do not rule out bacterial infection or co-infection with other pathogens not detected by the test. Clinical correlation with patient history and other diagnostic information is necessary to determine patient infection status. The expected result is Negative. Fact Sheet for Patients:  PinkCheek.be Fact Sheet for Healthcare Providers: GravelBags.it This test is not yet approved or cleared by the [place] FDA and  has been authorized for detection and/or diagnosis of SARS-CoV-2 by FDA under an Emergency Use Authorization (EUA).  This EUA will remain in effect (meaning this test can be used) fo r the duration of  the COVID-19 declaration under Section 564(b)(1) of the Act, 21 U.S.C. section 360bbb-3(b)(1), unless the authorization is terminated or revoked sooner.    Influenza A by PCR NEGATIVE NEGATIVE Final   Influenza B by PCR NEGATIVE NEGATIVE Final    Comment: (NOTE) The Xpert Xpress SARS-CoV-2/FLU/RSV assay is intended as an aid in  the diagnosis of influenza from Nasopharyngeal swab specimens and  should not be used as a sole basis for treatment. Nasal washings and  aspirates are unacceptable for Xpert Xpress SARS-CoV-2/FLU/RSV  testing. Fact Sheet for Patients: PinkCheek.be Fact Sheet for Healthcare  Providers: GravelBags.it This test is not yet approved or cleared by the [place] FDA and  has been authorized for detection and/or diagnosis of SARS-CoV-2 by  FDA under an Emergency Use Authorization (EUA). This EUA will remain  in effect (meaning this test can be used) for the duration of the  Covid-19 declaration under Section 564(b)(1) of the Act, 21  U.S.C. section 360bbb-3(b)(1), unless the authorization is  terminated or revoked. Performed at [facility], [place] [address], [place], [place] [address]   Urine culture     Status: None   Collection Time: 07/26/19 10:41 AM   Specimen: Urine, Random  Result Value Ref Range Status   Specimen Description   Final    URINE, RANDOM Performed at [facility], [address] [place], [place] [address]    Special Requests   Final    NONE Performed at [facility], [address] [place], [place] [address]    Culture   Final    NO GROWTH Performed at [facility], [place] [address] [place], [place] [address]    Report Status 07/27/2019 FINAL  Final  Culture, blood (routine x 2)     Status: None (Preliminary result)   Collection Time: 07/26/19 11:00 AM   Specimen: BLOOD  Result Value Ref Range Status   Specimen Description   Final    BLOOD LEFT ARM Performed at [facility], [address] [place], [place] [address]    Special Requests   Final    BOTTLES DRAWN AEROBIC AND ANAEROBIC Blood Culture adequate volume Performed at [facility], [address] [place], [place] [address]    Culture   Final    NO GROWTH 2 DAYS Performed at [facility], [place] [address], [place], [place] [address]    Report Status PENDING  Incomplete  Culture, blood (routine x 2)     Status: Abnormal   Collection Time: 07/26/19 11:10 AM   Specimen: BLOOD RIGHT HAND  Result Value Ref Range Status   Specimen Description   Final    BLOOD RIGHT HAND Performed at  [facility], [address], [place], [place] [address]    Special Requests   Final  BOTTLES DRAWN AEROBIC AND ANAEROBIC Blood Culture adequate volume Performed at [facility], [address] [place], [place] [address]    Culture  Setup Time   Final    GRAM POSITIVE COCCI IN CHAINS IN BOTH AEROBIC AND ANAEROBIC BOTTLES CRITICAL RESULT CALLED TO, READ BACK BY AND VERIFIED WITH: S. CHRISTY PHARMD, AT 9381 07/27/19 BY D. VANHOOK    Culture (A)  Final    STAPHYLOCOCCUS SPECIES (COAGULASE NEGATIVE) STREPTOCOCCUS MITIS/ORALIS THE SIGNIFICANCE OF ISOLATING THIS ORGANISM FROM A SINGLE SET OF BLOOD CULTURES WHEN MULTIPLE SETS ARE DRAWN IS UNCERTAIN. PLEASE NOTIFY THE MICROBIOLOGY DEPARTMENT WITHIN ONE WEEK IF SPECIATION AND SENSITIVITIES ARE REQUIRED. Performed at [facility], [place] [address] [place], [place] [address]    Report Status 07/29/2019 FINAL  Final     Labs: Basic Metabolic Panel: Recent Labs  Lab 07/26/19 0913 07/26/19 0927 07/26/19 1040 07/27/19 0310 07/28/19 0233 07/29/19 0216  NA 130* 131*  --  135 139 137  K 3.9 3.9  --  4.5 4.7 4.2  CL 96*  --   --  102 105 102  CO2 23  --   --  '22 25 25  ' GLUCOSE 141*  --   --  124* 142* 135*  BUN 10  --   --  '15 19 19  ' CREATININE 0.59*  --   --  0.64 0.68 0.55*  CALCIUM 8.4*  --   --  8.4* 8.5* 8.6*  MG  --   --  2.3 2.2 2.2 2.2  PHOS  --   --  3.1  --   --   --    Liver Function Tests: Recent Labs  Lab 07/26/19 0913 07/27/19 0310 07/28/19 0233 07/29/19 0216  AST 92* 72* 59* 48*  ALT 54* 50* 49* 52*  ALKPHOS 68 68 69 64  BILITOT 0.5 0.6 0.5 0.6  PROT 7.0 6.9 6.8 6.9  ALBUMIN 3.2* 3.1* 3.2* 3.3*   Recent Labs  Lab 07/26/19 0913  LIPASE 69*   No results for input(s): AMMONIA in the last 168 hours. CBC: Recent Labs  Lab 07/26/19 0913 07/26/19 0927 07/27/19 0310 07/28/19 0233 07/29/19 0216  WBC 3.9*  --  2.5* 4.6 6.1  NEUTROABS 2.0  --  1.2* 2.8 3.7  HGB 14.5 15.0 13.9 13.8  14.3  HCT 44.4 44.0 43.2 43.3 44.3  MCV 86.7  --  87.4 87.3 86.5  PLT 330  --  428* 537* 587*   Cardiac Enzymes: No results for input(s): CKTOTAL, CKMB, CKMBINDEX, TROPONINI in the last 168 hours. BNP: BNP (last 3 results) Recent Labs    07/26/19 0913  BNP 87.3    ProBNP (last 3 results) No results for input(s): PROBNP in the last 8760 hours.  CBG: Recent Labs  Lab 07/26/19 1425  GLUCAP 129*       Signed:  Nita Sells MD   [facility] 07/29/2019, 9:55 AM

## 2019-07-29 NOTE — Discharge Instructions (Signed)
You are scheduled for an outpatient infusion of Remdesivir at 10:00 AM on Friday 2/12.    Please report to Lynnell Catalan at [address].  Drive to the security guard and tell them you are here for an infusion. They will direct you to the front entrance where we will come and get you.  For questions call [phone].  Thanks

## 2019-07-29 NOTE — TOC Initial Note (Signed)
Transition of Care [facility]) - Initial/Assessment Note    Patient Details  Name: Tyler Carter MRN: [id number] Date of Birth: 1951-01-16  Transition of Care [facility]) CM/SW Contact:    Tyler Carter, [place] Phone Number: [phone] 07/29/2019, 10:48 AM  Clinical Narrative:                  CSW spoke with patient's son Tyler Carter regarding PT recommendation of home health services. He is in agreement with this, no preference of agency.   CSW spoke with Tyler Carter with [facility] who reports they are able to accept patient for home health PT and OT.   CSW has requested PT and OT home health orders from MD.   Tyler Carter reports he will be picking up patient today and requests RN contact him when patient is ready to go.   Expected Discharge Plan: [place] Barriers to Discharge: No Barriers Identified   Patient Goals and CMS Choice   CMS Medicare.gov Compare Post Acute Care list provided to:: Patient Represenative (must comment)(Tyler Carter (son )) Choice offered to / list presented to : Adult Children  Expected Discharge Plan and Services Expected Discharge Plan: [place] Choice: Home Health Living arrangements for the past 2 months: Single Family Home Expected Discharge Date: 07/29/19                         HH Arranged: PT, OT HH Agency: [place] Date [place]: 07/29/19 Time HH Agency Contacted: 17 Representative spoke with at [place]: Tyler Carter  Prior Living Arrangements/Services Living arrangements for the past 2 months: [place] Lives with:: Adult Children Patient language and need for interpreter reviewed:: Yes Do you feel safe going back to the place where you live?: Yes      Need for Family Participation in Patient Care: Yes (Comment) Care giver support system in place?: Yes (comment)   Criminal Activity/Legal Involvement Pertinent to Current Situation/Hospitalization: No - Comment as  needed  Activities of Daily Living Home Assistive Devices/Equipment: Eyeglasses ADL Screening (condition at time of admission) Patient's cognitive ability adequate to safely complete daily activities?: Yes Is the patient deaf or have difficulty hearing?: No Does the patient have difficulty seeing, even when wearing glasses/contacts?: No Does the patient have difficulty concentrating, remembering, or making decisions?: Yes(stated with interepter he has short term and long term memor) Patient able to express need for assistance with ADLs?: Yes Does the patient have difficulty dressing or bathing?: Yes Independently performs ADLs?: No Communication: Independent Is this a change from baseline?: Pre-admission baseline Dressing (OT): Needs assistance Is this a change from baseline?: Pre-admission baseline Grooming: Needs assistance Feeding: Needs assistance Is this a change from baseline?: Pre-admission baseline Bathing: Needs assistance Is this a change from baseline?: Pre-admission baseline Toileting: Needs assistance Is this a change from baseline?: Pre-admission baseline In/Out Bed: Needs assistance Is this a change from baseline?: Pre-admission baseline Walks in Home: Needs assistance Is this a change from baseline?: Pre-admission baseline Does the patient have difficulty walking or climbing stairs?: Yes Weakness of Legs: None Weakness of Arms/Hands: None  Permission Sought/Granted Permission sought to share information with : Case Manager, Customer service manager, Family Supports Permission granted to share information with : Yes, Verbal Permission Granted  Share Information with NAME: Tyler Carter  Permission granted to share info w AGENCY: [place] granted to share info w  Relationship: son  Permission granted to share info w Contact Information: [phone]  Emotional Assessment Appearance:: Other (Comment Required(unable to assess -  remote) Attitude/Demeanor/Rapport: Unable to Assess Affect (typically observed): Unable to Assess Orientation: : Oriented to Self, Oriented to Place, Oriented to  Time, Oriented to Situation Alcohol / Substance Use: Not Applicable Psych Involvement: No (comment)  Admission diagnosis:  Hyponatremia [E87.1] Atypical pneumonia [J18.9] Acute hypoxemic respiratory failure due to COVID-19 (HCC) [U07.1, J96.01] Pneumonia due to COVID-19 virus [U07.1, J12.82] Patient Active Problem List   Diagnosis Date Noted  . Atypical pneumonia 07/26/2019  . Pneumonia due to COVID-19 virus 07/26/2019  . Acute respiratory failure with hypoxemia (HCC) 07/26/2019  . Hyponatremia 07/26/2019  . Essential hypertension 07/26/2019  . Chronic hepatitis B (HCC) 10/06/2017  . Chronic right shoulder pain 10/06/2017  . Language barrier to communication 10/06/2017  . Tinnitus of both ears 10/06/2017  . Medial epicondylitis of right elbow 10/06/2017  . Chronic nonintractable headache 08/26/2017  . H/O partial nephrectomy 11/12/2016  . History of kidney cancer 11/12/2016  . History of gallstones 11/12/2016  . Erectile dysfunction 04/06/2015  . Tobacco use disorder 04/06/2015  . Hyperlipidemia 10/01/2014  . Hep B w/o coma 09/10/2014   PCP:  Georgina Quint, MD Pharmacy:   [id number] [place], [place] - [phone] AT [facility] OF [facility] & [address] [place] [address] Phone: [phone] Fax: [phone]  [id number] - [place], [place] - [address] AT [facility] OF [address] [place] [address] Phone: [phone] Fax: [phone]     Social Determinants of Health (SDOH) Interventions    Readmission Risk Interventions No flowsheet data found.

## 2019-07-30 ENCOUNTER — Encounter (HOSPITAL_COMMUNITY): Payer: Self-pay

## 2019-07-30 ENCOUNTER — Ambulatory Visit (HOSPITAL_COMMUNITY)
Admission: RE | Admit: 2019-07-30 | Discharge: 2019-07-30 | Disposition: A | Discharge status: Home / Self Care | Payer: 59 | Source: Ambulatory Visit | Attending: Pulmonary Disease | Admitting: Pulmonary Disease

## 2019-07-30 DIAGNOSIS — U071 COVID-19: Secondary | ICD-10-CM | POA: Insufficient documentation

## 2019-07-30 DIAGNOSIS — J1289 Other viral pneumonia: Secondary | ICD-10-CM | POA: Diagnosis not present

## 2019-07-30 MED ORDER — SODIUM CHLORIDE 0.9 % IV SOLN
100.0000 mg | Freq: Once | INTRAVENOUS | Status: AC
  Administered 2019-07-30: 11:00:00 100 mg via INTRAVENOUS
  Filled 2019-07-30: qty 20

## 2019-07-30 MED ORDER — SODIUM CHLORIDE 0.9 % IV SOLN: INTRAVENOUS | Status: DC | PRN

## 2019-07-30 MED ORDER — EPINEPHRINE 0.3 MG/0.3ML IJ SOAJ: 0.3000 mg | Freq: Once | INTRAMUSCULAR | Status: DC | PRN

## 2019-07-30 MED ORDER — DIPHENHYDRAMINE HCL 50 MG/ML IJ SOLN: 50.0000 mg | Freq: Once | INTRAMUSCULAR | Status: DC | PRN

## 2019-07-30 MED ORDER — FAMOTIDINE IN NACL 20-0.9 MG/50ML-% IV SOLN: 20.0000 mg | Freq: Once | INTRAVENOUS | Status: DC | PRN

## 2019-07-30 MED ORDER — ALBUTEROL SULFATE HFA 108 (90 BASE) MCG/ACT IN AERS: 2.0000 | INHALATION_SPRAY | Freq: Once | RESPIRATORY_TRACT | Status: DC | PRN

## 2019-07-30 MED ORDER — METHYLPREDNISOLONE SODIUM SUCC 125 MG IJ SOLR: 125.0000 mg | Freq: Once | INTRAMUSCULAR | Status: DC | PRN

## 2019-07-30 NOTE — Progress Notes (Signed)
  Diagnosis: COVID-19  Physician: Dr. Wright  Procedure: [facility] Infusion [facility] Med: remdesivir infusion.  Complications: No immediate complications noted.  Discharge: Discharged home   Petr Bontempo N Alayssa Flinchum 07/30/2019  

## 2019-07-31 LAB — CULTURE, BLOOD (ROUTINE X 2)
Culture: NO GROWTH
Special Requests: ADEQUATE

## 2021-11-28 ENCOUNTER — Other Ambulatory Visit (HOSPITAL_COMMUNITY): Payer: Self-pay

## 2021-11-28 ENCOUNTER — Telehealth: Payer: Self-pay

## 2021-11-28 NOTE — Telephone Encounter (Signed)
[facility] Patient Advocate Encounter ? ?Insurance verification completed.   ? ?The patient is uninsured and will need patient assistance for medication. ? ?We can complete the application and will need to meet with the patient for signatures and income documentation. ? ?Blondine Hottel, CPhT ?Specialty Pharmacy Patient Advocate ?[facility] ?Phone: [phone] ?Fax:  [phone]  ?

## 2021-11-29 ENCOUNTER — Ambulatory Visit: Payer: Self-pay | Admitting: Internal Medicine

## 2021-12-05 ENCOUNTER — Ambulatory Visit: Payer: 59 | Admitting: Internal Medicine

## 2021-12-05 ENCOUNTER — Encounter: Payer: Self-pay | Admitting: Internal Medicine

## 2021-12-05 ENCOUNTER — Other Ambulatory Visit: Payer: Self-pay

## 2021-12-05 DIAGNOSIS — B181 Chronic viral hepatitis B without delta-agent: Secondary | ICD-10-CM

## 2021-12-05 NOTE — Progress Notes (Signed)
[facility]  Reason for Consult: Chronic hepatitis B Referring Provider: Mosie Lukes NP  Assessment: I will update his blood work and see him back in 3 weeks to determine if he is likely to benefit from treatment of his chronic hepatitis B.   Plan: Check CBC, CMP, hepatitis B e antigen, hepatitis B e antibody, hepatitis B DNA viral load, hepatitis C and HIV antibodies and fibrosis score Follow-up in 3 weeks  Patient Active Problem List   Diagnosis Date Noted   Chronic hepatitis B (HCC) 10/06/2017    Priority: High   Atypical pneumonia 07/26/2019   Pneumonia due to COVID-19 virus 07/26/2019   Acute respiratory failure with hypoxemia (HCC) 07/26/2019   Hyponatremia 07/26/2019   Essential hypertension 07/26/2019   Chronic right shoulder pain 10/06/2017   Language barrier to communication 10/06/2017   Tinnitus of both ears 10/06/2017   Medial epicondylitis of right elbow 10/06/2017   Chronic nonintractable headache 08/26/2017   H/O partial nephrectomy 11/12/2016   History of kidney cancer 11/12/2016   History of gallstones 11/12/2016   Erectile dysfunction 04/06/2015   Tobacco use disorder 04/06/2015   Hyperlipidemia 10/01/2014    Patient's Medications  New Prescriptions   No medications on file  Previous Medications   ACETAMINOPHEN (TYLENOL) 500 MG TABLET    Take 1,000 mg by mouth every 6 (six) hours as needed (for fever).   AMLODIPINE (NORVASC) 5 MG TABLET    Take 5 mg by mouth daily.   AZELASTINE (ASTELIN) 0.1 % NASAL SPRAY    Place 2 sprays into both nostrils 2 (two) times daily. Use in each nostril as directed   CHLORPHENIRAMINE-HYDROCODONE (TUSSIONEX) 10-8 MG/5ML SUER    Take 5 mLs by mouth every 12 (twelve) hours as needed for cough.   DEXAMETHASONE (DECADRON) 6 MG TABLET    Take 1 tablet (6 mg total) by mouth daily.   FAMOTIDINE (PEPCID) 20 MG TABLET    Take 1 tablet (20 mg total) by mouth daily.   MULTIPLE VITAMINS-MINERALS  (EYE VITAMINS & MINERALS PO)    Take 1 tablet by mouth 2 (two) times daily.   OMEGA-3 FATTY ACIDS (FISH OIL) 1000 MG CAPS    Take 1,000 mg by mouth 2 (two) times daily.    POLYETHYL GLYCOL-PROPYL GLYCOL (LUBRICANT EYE DROPS) 0.4-0.3 % SOLN    Place 1 drop into both eyes 3 (three) times daily as needed (dry/irritated eyes.).   POLYETHYLENE GLYCOL (MIRALAX / GLYCOLAX) 17 G PACKET    Take 17 g by mouth daily as needed for mild constipation.   ROSUVASTATIN (CRESTOR) 10 MG TABLET    Take 10 mg by mouth daily.  Modified Medications   No medications on file  Discontinued Medications   No medications on file    HPI: Tyler Carter is a 71 y.o. male with a history of hepatitis B.  He immigrated to the [place] from [place] in 2013.  He recalls being told that he had had hepatitis B while he was still living in [place].  He is not aware of ever having had any complications from his hepatitis.  He was evaluated by my former partner, Dr. Enedina Finner in 2016.  His AST was elevated at 67 and his ALT was elevated at 108.  His hepatitis B e antigen was negative and his e antibody was positive.  His hepatitis B DNA viral load was 181,437.  Dr. Ninetta Lights prescribed Entecavir but he never took it.  When he returned in May 2017 he was prescribed tenofovir but we have no evidence that he never filled the prescription.  An abdominal ultrasound obtained in 2018 showed no liver abnormalities.  He was evaluated again in February 2021 by a gastroenterologist, Dr. Rinaldo Ratel.  His liver enzymes were normal.  His e antigen was again negative.  His viral load was 2940.  He never followed up with Dr. Elpidio Anis.  He denies any abdominal pain, nausea, vomiting diarrhea or other changes in his stool habits.  He says that his appetite is normal and that he has not lost any weight recently.  He repeatedly tells me that his memory is very poor.  He does not have memory of seeing doctors previously for hepatitis B he says that he is only on 1 pill a  day, a blood pressure medication that he does not know the name of.  He says that he frequently forgets to take it.  He lives with his wife and children.  Review of Systems: Review of Systems  Constitutional:  Negative for fever and weight loss.  Respiratory:  Negative for cough and shortness of breath.   Cardiovascular:  Negative for chest pain.  Gastrointestinal:  Negative for abdominal pain, diarrhea, nausea and vomiting.  Skin:  Negative for rash.  Psychiatric/Behavioral:  Positive for memory loss. The patient has insomnia.       Past Medical History:  Diagnosis Date   Chronic kidney disease    Hepatitis B    High cholesterol    Hypertension    Tobacco abuse     Social History   Tobacco Use   Smoking status: Former    Years: 20.00    Types: Cigarettes    Quit date: 01/23/2019    Years since quitting: 2.8   Smokeless tobacco: Never   Tobacco comments:    would like to quit states "can not"  Vaping Use   Vaping Use: Never used  Substance Use Topics   Alcohol use: No    Alcohol/week: 0.0 standard drinks of alcohol   Drug use: No    Family History  Problem Relation Age of Onset   Cancer Father        throat cancer   No Known Allergies  OBJECTIVE: Vitals:   12/05/21 1122  BP: 131/80  Pulse: 80  Temp: (!) 97.5 F (36.4 C)  TempSrc: Oral  SpO2: (!) 80%  Weight: 152 lb (68.9 kg)   Body mass index is 25.1 kg/m.   Physical Exam Constitutional:      Comments: He was examined with the aid of the [place] interpreter.  He is calm and pleasant.  Cardiovascular:     Rate and Rhythm: Normal rate and regular rhythm.     Heart sounds: No murmur heard. Pulmonary:     Effort: Pulmonary effort is normal.     Breath sounds: Normal breath sounds.  Abdominal:     General: There is no distension.     Palpations: Abdomen is soft. There is no mass.     Tenderness: There is no abdominal tenderness.  Psychiatric:        Mood and Affect: Mood normal.      Microbiology: No results found for this or any previous visit (from the past 240 hour(s)).  Cliffton Asters, MD [facility] for Infectious Disease [facility] [phone] pager   [phone] cell 12/05/2021, 11:41 AM

## 2021-12-05 NOTE — Assessment & Plan Note (Signed)
I will update his blood work and see him back in 3 weeks to determine if he is likely to benefit from treatment of his chronic hepatitis B.

## 2021-12-11 LAB — CBC
HCT: 50.5 % — ABNORMAL HIGH (ref 38.5–50.0)
Hemoglobin: 17.1 g/dL (ref 13.2–17.1)
MCH: 28.6 pg (ref 27.0–33.0)
MCHC: 33.9 g/dL (ref 32.0–36.0)
MCV: 84.6 fL (ref 80.0–100.0)
MPV: 10 fL (ref 7.5–12.5)
Platelets: 289 10*3/uL (ref 140–400)
RBC: 5.97 10*6/uL — ABNORMAL HIGH (ref 4.20–5.80)
RDW: 12.4 % (ref 11.0–15.0)
WBC: 5.4 10*3/uL (ref 3.8–10.8)

## 2021-12-11 LAB — COMPREHENSIVE METABOLIC PANEL
AG Ratio: 1.6 (calc) (ref 1.0–2.5)
ALT: 20 U/L (ref 9–46)
AST: 21 U/L (ref 10–35)
Albumin: 4.9 g/dL (ref 3.6–5.1)
Alkaline phosphatase (APISO): 73 U/L (ref 35–144)
BUN: 12 mg/dL (ref 7–25)
CO2: 27 mmol/L (ref 20–32)
Calcium: 9.6 mg/dL (ref 8.6–10.3)
Chloride: 103 mmol/L (ref 98–110)
Creat: 0.76 mg/dL (ref 0.70–1.28)
Globulin: 3.1 g/dL (calc) (ref 1.9–3.7)
Glucose, Bld: 103 mg/dL — ABNORMAL HIGH (ref 65–99)
Potassium: 4.2 mmol/L (ref 3.5–5.3)
Sodium: 140 mmol/L (ref 135–146)
Total Bilirubin: 0.8 mg/dL (ref 0.2–1.2)
Total Protein: 8 g/dL (ref 6.1–8.1)

## 2021-12-11 LAB — LIVER FIBROSIS, FIBROTEST-ACTITEST
ALT: 20 U/L (ref 9–46)
Alpha-2-Macroglobulin: 318 mg/dL — ABNORMAL HIGH (ref 106–279)
Apolipoprotein A1: 139 mg/dL (ref 94–176)
Bilirubin: 0.8 mg/dL (ref 0.2–1.2)
Fibrosis Score: 0.77
GGT: 28 U/L (ref 3–70)
Haptoglobin: 47 mg/dL (ref 43–212)
Necroinflammat ACT Score: 0.15
Reference ID: 4428848

## 2021-12-11 LAB — HIV ANTIBODY (ROUTINE TESTING W REFLEX): HIV 1&2 Ab, 4th Generation: NONREACTIVE

## 2021-12-11 LAB — HEPATITIS B E ANTIGEN: Hep B E Ag: NONREACTIVE

## 2021-12-11 LAB — HEPATITIS B E ANTIBODY: Hep B E Ab: REACTIVE — AB

## 2021-12-11 LAB — HEPATITIS C ANTIBODY: Hepatitis C Ab: NONREACTIVE

## 2021-12-11 LAB — HEPATITIS B DNA, ULTRAQUANTITATIVE, PCR
Hepatitis B DNA: 4030 IU/mL — ABNORMAL HIGH
Hepatitis B virus DNA: 3.61 Log IU/mL — ABNORMAL HIGH

## 2021-12-17 ENCOUNTER — Telehealth: Payer: Self-pay

## 2021-12-17 NOTE — Telephone Encounter (Signed)
Patient son called to ask Dr.Campbell was a medication being sent in for patient Hep B. I advised patient son that Candelaria Stagers is currently out of the office but I would send a message back and when he responds I will give him a call back.Marcell Anger, CMA

## 2021-12-19 NOTE — Telephone Encounter (Signed)
Patient son aware

## 2021-12-26 ENCOUNTER — Other Ambulatory Visit: Payer: Self-pay

## 2021-12-26 ENCOUNTER — Ambulatory Visit: Payer: 59 | Admitting: Internal Medicine

## 2021-12-26 ENCOUNTER — Encounter: Payer: Self-pay | Admitting: Internal Medicine

## 2021-12-26 DIAGNOSIS — B181 Chronic viral hepatitis B without delta-agent: Secondary | ICD-10-CM

## 2021-12-26 NOTE — Progress Notes (Signed)
[facility]  Patient Active Problem List   Diagnosis Date Noted   Chronic hepatitis B (HCC) 10/06/2017    Priority: High   Atypical pneumonia 07/26/2019   Pneumonia due to COVID-19 virus 07/26/2019   Acute respiratory failure with hypoxemia (HCC) 07/26/2019   Hyponatremia 07/26/2019   Essential hypertension 07/26/2019   Chronic right shoulder pain 10/06/2017   Language barrier to communication 10/06/2017   Tinnitus of both ears 10/06/2017   Medial epicondylitis of right elbow 10/06/2017   Chronic nonintractable headache 08/26/2017   H/O partial nephrectomy 11/12/2016   History of kidney cancer 11/12/2016   History of gallstones 11/12/2016   Erectile dysfunction 04/06/2015   Tobacco use disorder 04/06/2015   Hyperlipidemia 10/01/2014    Patient's Medications  New Prescriptions   No medications on file  Previous Medications   ACETAMINOPHEN (TYLENOL) 500 MG TABLET    Take 1,000 mg by mouth every 6 (six) hours as needed (for fever).   AMLODIPINE (NORVASC) 5 MG TABLET    Take 5 mg by mouth daily.   AZELASTINE (ASTELIN) 0.1 % NASAL SPRAY    Place 2 sprays into both nostrils 2 (two) times daily. Use in each nostril as directed   CHLORPHENIRAMINE-HYDROCODONE (TUSSIONEX) 10-8 MG/5ML SUER    Take 5 mLs by mouth every 12 (twelve) hours as needed for cough.   DEXAMETHASONE (DECADRON) 6 MG TABLET    Take 1 tablet (6 mg total) by mouth daily.   FAMOTIDINE (PEPCID) 20 MG TABLET    Take 1 tablet (20 mg total) by mouth daily.   MULTIPLE VITAMINS-MINERALS (EYE VITAMINS & MINERALS PO)    Take 1 tablet by mouth 2 (two) times daily.   OMEGA-3 FATTY ACIDS (FISH OIL) 1000 MG CAPS    Take 1,000 mg by mouth 2 (two) times daily.    POLYETHYL GLYCOL-PROPYL GLYCOL (LUBRICANT EYE DROPS) 0.4-0.3 % SOLN    Place 1 drop into both eyes 3 (three) times daily as needed (dry/irritated eyes.).   POLYETHYLENE GLYCOL (MIRALAX / GLYCOLAX) 17 G PACKET    Take 17 g by mouth daily  as needed for mild constipation.   ROSUVASTATIN (CRESTOR) 10 MG TABLET    Take 10 mg by mouth daily.  Modified Medications   No medications on file  Discontinued Medications   No medications on file    Subjective: Tyler Carter is in for Tyler Carter routine follow-up visit.  Tyler Carter denies any health concerns or changes since Tyler Carter initial visit 1 month ago.  Review of Systems: Review of Systems  Constitutional:  Negative for fever.  Gastrointestinal:  Negative for abdominal pain, nausea and vomiting.    Past Medical History:  Diagnosis Date   Chronic kidney disease    Hepatitis B    High cholesterol    Hypertension    Tobacco abuse     Social History   Tobacco Use   Smoking status: Former    Years: 20.00    Types: Cigarettes    Quit date: 01/23/2019    Years since quitting: 2.9   Smokeless tobacco: Never   Tobacco comments:    States Tyler Carter quit prior to Covid (2019)  Vaping Use   Vaping Use: Never used  Substance Use Topics   Alcohol use: No    Alcohol/week: 0.0 standard drinks of alcohol   Drug use: No    Family History  Problem Relation Age of Onset   Cancer Father  throat cancer    No Known Allergies  Objective: Vitals:   12/26/21 0929  BP: 132/77  Pulse: 86  Temp: 98.3 F (36.8 C)  TempSrc: Oral  SpO2: 95%  Weight: 159 lb (72.1 kg)  Height: 5\' 6"  (1.676 m)   Body mass index is 25.66 kg/m.  Physical Exam Constitutional:      Comments: Tyler Carter is pleasant and in no distress.  Tyler Carter was interviewed today with the aid of the interpreter.  Cardiovascular:     Rate and Rhythm: Normal rate and regular rhythm.     Heart sounds: No murmur heard. Pulmonary:     Effort: Pulmonary effort is normal.     Breath sounds: Normal breath sounds.  Abdominal:     Palpations: Abdomen is soft. There is no mass.     Tenderness: There is no abdominal tenderness.  Psychiatric:        Mood and Affect: Mood normal.     Lab Results 12/05/2021 Liver enzymes  normal Hepatitis B e antigen negative Hepatitis B e antibody positive Hepatitis B DNA viral load 4030 Fibrosis score F4 HIV antibody negative Hepatitis C antibody negative  Abdominal ultrasound 05/21/2017 Liver: No focal lesion identified. Within normal limits in parenchymal echogenicity. Portal vein is patent on color Doppler imaging with normal direction of blood flow towards the liver.   Problem List Items Addressed This Visit       High   Chronic hepatitis B (HCC)    Tyler Carter has very chronic hepatitis B.  Tyler Carter recent lab work looks fairly good with the exception of Tyler Carter fibrosis score which I believe probably over estimates liver fibrosis.  No liver abnormalities were noted on Tyler Carter ultrasound 4-1/2 years ago.  I talked to him about repeat ultrasound but Tyler Carter was concerned about any ability to pay for further testing.  Given Tyler Carter age and other medical problems I am not sure that Tyler Carter will receive significant benefit from hepatitis B treatment at this time.  Tyler Carter will follow-up for repeat evaluation in 6 months.        14/10/2016, MD [facility] for Infectious Disease [facility] [phone] pager   [phone] cell 12/26/2021, 11:33 AM

## 2021-12-26 NOTE — Assessment & Plan Note (Signed)
He has very chronic hepatitis B.  His recent lab work looks fairly good with the exception of his fibrosis score which I believe probably over estimates liver fibrosis.  No liver abnormalities were noted on his ultrasound 4-1/2 years ago.  I talked to him about repeat ultrasound but he was concerned about any ability to pay for further testing.  Given his age and other medical problems I am not sure that he will receive significant benefit from hepatitis B treatment at this time.  He will follow-up for repeat evaluation in 6 months.

## 2022-05-28 ENCOUNTER — Other Ambulatory Visit (HOSPITAL_BASED_OUTPATIENT_CLINIC_OR_DEPARTMENT_OTHER): Payer: Self-pay

## 2022-05-28 DIAGNOSIS — G4733 Obstructive sleep apnea (adult) (pediatric): Secondary | ICD-10-CM

## 2022-07-01 ENCOUNTER — Ambulatory Visit: Payer: Self-pay | Admitting: Internal Medicine
